# Patient Record
Sex: Female | Born: 2000 | Race: Black or African American | Hispanic: No | Marital: Single | Smoking: Never smoker
Health system: Southern US, Community
[De-identification: ages and names within clinical notes are randomized; demographics above are authoritative.]

## PROBLEM LIST (undated history)

## (undated) DIAGNOSIS — I619 Nontraumatic intracerebral hemorrhage, unspecified: Secondary | ICD-10-CM

## (undated) DIAGNOSIS — J302 Other seasonal allergic rhinitis: Secondary | ICD-10-CM

## (undated) DIAGNOSIS — H9190 Unspecified hearing loss, unspecified ear: Secondary | ICD-10-CM

## (undated) DIAGNOSIS — T7492XA Unspecified child maltreatment, confirmed, initial encounter: Secondary | ICD-10-CM

## (undated) DIAGNOSIS — R55 Syncope and collapse: Secondary | ICD-10-CM

## (undated) DIAGNOSIS — H53003 Unspecified amblyopia, bilateral: Secondary | ICD-10-CM

## (undated) DIAGNOSIS — J45909 Unspecified asthma, uncomplicated: Secondary | ICD-10-CM

## (undated) DIAGNOSIS — F909 Attention-deficit hyperactivity disorder, unspecified type: Secondary | ICD-10-CM

## (undated) DIAGNOSIS — T730XXA Starvation, initial encounter: Secondary | ICD-10-CM

## (undated) HISTORY — PX: EYE SURGERY: SHX253

## (undated) HISTORY — PX: TYMPANOSTOMY TUBE PLACEMENT: SHX32

---
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
## 2021-02-16 ENCOUNTER — Encounter: Payer: Self-pay | Admitting: Internal Medicine

## 2021-02-16 ENCOUNTER — Ambulatory Visit (INDEPENDENT_AMBULATORY_CARE_PROVIDER_SITE_OTHER): Payer: Medicaid Other | Admitting: Internal Medicine

## 2021-02-16 ENCOUNTER — Other Ambulatory Visit: Payer: Self-pay

## 2021-02-16 VITALS — BP 106/61 | HR 72 | Temp 97.5°F | Resp 17 | Ht 71.2 in | Wt 208.6 lb

## 2021-02-16 DIAGNOSIS — Z6828 Body mass index (BMI) 28.0-28.9, adult: Secondary | ICD-10-CM

## 2021-02-16 DIAGNOSIS — F902 Attention-deficit hyperactivity disorder, combined type: Secondary | ICD-10-CM | POA: Diagnosis not present

## 2021-02-16 DIAGNOSIS — F3341 Major depressive disorder, recurrent, in partial remission: Secondary | ICD-10-CM | POA: Diagnosis not present

## 2021-02-16 DIAGNOSIS — R519 Headache, unspecified: Secondary | ICD-10-CM

## 2021-02-16 DIAGNOSIS — K5909 Other constipation: Secondary | ICD-10-CM

## 2021-02-16 DIAGNOSIS — Z833 Family history of diabetes mellitus: Secondary | ICD-10-CM

## 2021-02-16 DIAGNOSIS — D500 Iron deficiency anemia secondary to blood loss (chronic): Secondary | ICD-10-CM | POA: Diagnosis not present

## 2021-02-16 DIAGNOSIS — Z136 Encounter for screening for cardiovascular disorders: Secondary | ICD-10-CM

## 2021-02-16 DIAGNOSIS — E663 Overweight: Secondary | ICD-10-CM | POA: Insufficient documentation

## 2021-02-16 DIAGNOSIS — E6609 Other obesity due to excess calories: Secondary | ICD-10-CM | POA: Insufficient documentation

## 2021-02-16 DIAGNOSIS — Z23 Encounter for immunization: Secondary | ICD-10-CM | POA: Diagnosis not present

## 2021-02-16 DIAGNOSIS — E66811 Obesity, class 1: Secondary | ICD-10-CM | POA: Insufficient documentation

## 2021-02-16 NOTE — Patient Instructions (Signed)
Anemia °Anemia is a condition in which there is not enough red blood cells or hemoglobin in the blood. Hemoglobin is a substance in red blood cells that carries oxygen. °When you do not have enough red blood cells or hemoglobin (are anemic), your body cannot get enough oxygen and your organs may not work properly. As a result, you may feel very tired or have other problems. °What are the causes? °Common causes of anemia include: °Excessive bleeding. Anemia can be caused by excessive bleeding inside or outside the body, including bleeding from the intestines or from heavy menstrual periods in females. °Poor nutrition. °Long-lasting (chronic) kidney, thyroid, and liver disease. °Bone marrow disorders, spleen problems, and blood disorders. °Cancer and treatments for cancer. °HIV (human immunodeficiency virus) and AIDS (acquired immunodeficiency syndrome). °Infections, medicines, and autoimmune disorders that destroy red blood cells. °What are the signs or symptoms? °Symptoms of this condition include: °Minor weakness. °Dizziness. °Headache, or difficulties concentrating and sleeping. °Heartbeats that feel irregular or faster than normal (palpitations). °Shortness of breath, especially with exercise. °Pale skin, lips, and nails, or cold hands and feet. °Indigestion and nausea. °Symptoms may occur suddenly or develop slowly. If your anemia is mild, you may not have symptoms. °How is this diagnosed? °This condition is diagnosed based on blood tests, your medical history, and a physical exam. In some cases, a test may be needed in which cells are removed from the soft tissue inside of a bone and looked at under a microscope (bone marrow biopsy). Your health care provider may also check your stool (feces) for blood and may do additional testing to look for the cause of your bleeding. °Other tests may include: °Imaging tests, such as a CT scan or MRI. °A procedure to see inside your esophagus and stomach (endoscopy). °A  procedure to see inside your colon and rectum (colonoscopy). °How is this treated? °Treatment for this condition depends on the cause. If you continue to lose a lot of blood, you may need to be treated at a hospital. Treatment may include: °Taking supplements of iron, vitamin B12, or folic acid. °Taking a hormone medicine (erythropoietin) that can help to stimulate red blood cell growth. °Having a blood transfusion. This may be needed if you lose a lot of blood. °Making changes to your diet. °Having surgery to remove your spleen. °Follow these instructions at home: °Take over-the-counter and prescription medicines only as told by your health care provider. °Take supplements only as told by your health care provider. °Follow any diet instructions that you were given by your health care provider. °Keep all follow-up visits as told by your health care provider. This is important. °Contact a health care provider if: °You develop new bleeding anywhere in the body. °Get help right away if: °You are very weak. °You are short of breath. °You have pain in your abdomen or chest. °You are dizzy or feel faint. °You have trouble concentrating. °You have bloody stools, black stools, or tarry stools. °You vomit repeatedly or you vomit up blood. °These symptoms may represent a serious problem that is an emergency. Do not wait to see if the symptoms will go away. Get medical help right away. Call your local emergency services (911 in the [place] Do not drive yourself to the hospital. °Summary °Anemia is a condition in which you do not have enough red blood cells or enough of a substance in your red blood cells that carries oxygen (hemoglobin). °Symptoms may occur suddenly or develop slowly. °If your anemia is   mild, you may not have symptoms. °This condition is diagnosed with blood tests, a medical history, and a physical exam. Other tests may be needed. °Treatment for this condition depends on the cause of the anemia. °This  information is not intended to replace advice given to you by your health care provider. Make sure you discuss any questions you have with your health care provider. °Document Revised: 12/16/2018 Document Reviewed: 12/16/2018 °Elsevier Patient Education © 2022 [employer] ° °

## 2021-02-16 NOTE — Assessment & Plan Note (Signed)
Encourage diet and exercise for weight loss 

## 2021-02-16 NOTE — Assessment & Plan Note (Signed)
Has recently improved
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
## 2021-02-16 NOTE — Assessment & Plan Note (Addendum)
Currently not medicated We will monitor

## 2021-02-16 NOTE — Assessment & Plan Note (Signed)
Currently on Effexor prescribed by neurology, not helping She will continue to follow with

## 2021-02-16 NOTE — Assessment & Plan Note (Signed)
Referral to psychologist placed per patient request Support offered  continue Effexor

## 2021-02-16 NOTE — Assessment & Plan Note (Signed)
CBC today.  

## 2021-02-17 ENCOUNTER — Encounter: Payer: Self-pay | Admitting: Internal Medicine

## 2021-02-17 LAB — COMPLETE METABOLIC PANEL WITH GFR
AG Ratio: 1.7 (calc) (ref 1.0–2.5)
ALT: 13 U/L (ref 5–32)
AST: 13 U/L (ref 12–32)
Albumin: 4.5 g/dL (ref 3.6–5.1)
Alkaline phosphatase (APISO): 69 U/L (ref 36–128)
BUN: 18 mg/dL (ref 7–20)
CO2: 26 mmol/L (ref 20–32)
Calcium: 9.3 mg/dL (ref 8.9–10.4)
Chloride: 106 mmol/L (ref 98–110)
Creat: 0.91 mg/dL (ref 0.50–0.96)
Globulin: 2.7 g/dL (calc) (ref 2.0–3.8)
Glucose, Bld: 84 mg/dL (ref 65–139)
Potassium: 4.1 mmol/L (ref 3.8–5.1)
Sodium: 139 mmol/L (ref 135–146)
Total Bilirubin: 0.4 mg/dL (ref 0.2–1.1)
Total Protein: 7.2 g/dL (ref 6.3–8.2)
eGFR: 93 mL/min/{1.73_m2} (ref >=60)

## 2021-02-17 LAB — HEMOGLOBIN A1C
Hgb A1c MFr Bld: 4.9 % of total Hgb (ref <5.7)
Mean Plasma Glucose: 94 mg/dL
eAG (mmol/L): 5.2 mmol/L

## 2021-02-17 LAB — LIPID PANEL
Cholesterol: 148 mg/dL (ref <170)
HDL: 50 mg/dL (ref >45)
LDL Cholesterol (Calc): 85 mg/dL (calc) (ref <110)
Non-HDL Cholesterol (Calc): 98 mg/dL (calc) (ref <120)
Total CHOL/HDL Ratio: 3 (calc) (ref <5.0)
Triglycerides: 53 mg/dL (ref <90)

## 2021-02-17 LAB — CBC
HCT: 31.5 % — ABNORMAL LOW (ref 35.0–45.0)
Hemoglobin: 10.1 g/dL — ABNORMAL LOW (ref 11.7–15.5)
MCH: 28.9 pg (ref 27.0–33.0)
MCHC: 32.1 g/dL (ref 32.0–36.0)
MCV: 90.3 fL (ref 80.0–100.0)
MPV: 10.6 fL (ref 7.5–12.5)
Platelets: 282 10*3/uL (ref 140–400)
RBC: 3.49 10*6/uL — ABNORMAL LOW (ref 3.80–5.10)
RDW: 13.8 % (ref 11.0–15.0)
WBC: 3.8 10*3/uL (ref 3.8–10.8)

## 2021-04-11 ENCOUNTER — Ambulatory Visit: Payer: Medicaid Other | Admitting: Internal Medicine

## 2021-05-05 ENCOUNTER — Other Ambulatory Visit: Payer: Self-pay

## 2021-05-05 ENCOUNTER — Observation Stay: Payer: Medicaid Other

## 2021-05-05 ENCOUNTER — Observation Stay
Admission: EM | Admit: 2021-05-05 | Discharge: 2021-05-06 | Disposition: A | Discharge status: Home / Self Care | Payer: Medicaid Other | Attending: Emergency Medicine | Admitting: Emergency Medicine

## 2021-05-05 ENCOUNTER — Emergency Department: Payer: Medicaid Other

## 2021-05-05 DIAGNOSIS — E876 Hypokalemia: Secondary | ICD-10-CM | POA: Diagnosis not present

## 2021-05-05 DIAGNOSIS — R569 Unspecified convulsions: Secondary | ICD-10-CM | POA: Diagnosis not present

## 2021-05-05 DIAGNOSIS — F3341 Major depressive disorder, recurrent, in partial remission: Secondary | ICD-10-CM | POA: Diagnosis not present

## 2021-05-05 DIAGNOSIS — Z79899 Other long term (current) drug therapy: Secondary | ICD-10-CM | POA: Insufficient documentation

## 2021-05-05 DIAGNOSIS — W01198A Fall on same level from slipping, tripping and stumbling with subsequent striking against other object, initial encounter: Secondary | ICD-10-CM | POA: Insufficient documentation

## 2021-05-05 DIAGNOSIS — J45909 Unspecified asthma, uncomplicated: Secondary | ICD-10-CM | POA: Insufficient documentation

## 2021-05-05 DIAGNOSIS — R55 Syncope and collapse: Secondary | ICD-10-CM

## 2021-05-05 LAB — BASIC METABOLIC PANEL
Anion gap: 21 — ABNORMAL HIGH (ref 5–15)
BUN: 11 mg/dL (ref 6–20)
CO2: 12 mmol/L — ABNORMAL LOW (ref 22–32)
Calcium: 9.4 mg/dL (ref 8.9–10.3)
Chloride: 106 mmol/L (ref 98–111)
Creatinine, Ser: 1.04 mg/dL — ABNORMAL HIGH (ref 0.44–1.00)
GFR, Estimated: >60 mL/min (ref >60)
Glucose, Bld: 157 mg/dL — ABNORMAL HIGH (ref 70–99)
Potassium: 3.1 mmol/L — ABNORMAL LOW (ref 3.5–5.1)
Sodium: 139 mmol/L (ref 135–145)

## 2021-05-05 LAB — CBC
HCT: 36 % (ref 36.0–46.0)
Hemoglobin: 10.6 g/dL — ABNORMAL LOW (ref 12.0–15.0)
MCH: 28.2 pg (ref 26.0–34.0)
MCHC: 29.4 g/dL — ABNORMAL LOW (ref 30.0–36.0)
MCV: 95.7 fL (ref 80.0–100.0)
Platelets: 378 10*3/uL (ref 150–400)
RBC: 3.76 MIL/uL — ABNORMAL LOW (ref 3.87–5.11)
RDW: 13.8 % (ref 11.5–15.5)
WBC: 13.1 10*3/uL — ABNORMAL HIGH (ref 4.0–10.5)
nRBC: 0 % (ref 0.0–0.2)

## 2021-05-05 LAB — HCG, QUANTITATIVE, PREGNANCY: hCG, Beta Chain, Quant, S: <1 m[IU]/mL (ref <5)

## 2021-05-05 LAB — TROPONIN I (HIGH SENSITIVITY)
Troponin I (High Sensitivity): 4 ng/L (ref <18)
Troponin I (High Sensitivity): 4 ng/L (ref <18)

## 2021-05-05 LAB — MAGNESIUM: Magnesium: 2.1 mg/dL (ref 1.7–2.4)

## 2021-05-05 LAB — CBG MONITORING, ED: Glucose-Capillary: 134 mg/dL — ABNORMAL HIGH (ref 70–99)

## 2021-05-05 LAB — CK: Total CK: 447 U/L — ABNORMAL HIGH (ref 38–234)

## 2021-05-05 LAB — PHOSPHORUS: Phosphorus: 5.2 mg/dL — ABNORMAL HIGH (ref 2.5–4.6)

## 2021-05-05 MED ORDER — SODIUM CHLORIDE 0.9 % IV SOLN
1500.0000 mg | INTRAVENOUS | Status: AC
  Administered 2021-05-05: 1500 mg via INTRAVENOUS
  Filled 2021-05-05: qty 30

## 2021-05-05 MED ORDER — LORAZEPAM 2 MG/ML IJ SOLN
1.0000 mg | Freq: Once | INTRAMUSCULAR | Status: DC | PRN
Start: 2021-05-05 — End: 2021-05-06

## 2021-05-05 MED ORDER — ACETAMINOPHEN 325 MG PO TABS
650.0000 mg | ORAL_TABLET | ORAL | Status: DC | PRN
  Administered 2021-05-05 – 2021-05-06 (×3): 650 mg via ORAL
  Filled 2021-05-05 (×3): qty 2

## 2021-05-05 MED ORDER — MIDAZOLAM HCL 2 MG/2ML IJ SOLN
4.0000 mg | Freq: Once | INTRAMUSCULAR | Status: AC
Start: 2021-05-05 — End: 2021-05-05
  Administered 2021-05-05: 4 mg via INTRAMUSCULAR

## 2021-05-05 MED ORDER — GADOBUTROL 1 MMOL/ML IV SOLN
9.0000 mL | Freq: Once | INTRAVENOUS | Status: AC | PRN
  Administered 2021-05-05: 9 mL via INTRAVENOUS

## 2021-05-05 MED ORDER — ACETAMINOPHEN 650 MG RE SUPP: 650.0000 mg | RECTAL | Status: DC | PRN

## 2021-05-05 MED ORDER — ORAL CARE MOUTH RINSE
15.0000 mL | OROMUCOSAL | Status: DC
  Administered 2021-05-05: 15 mL via OROMUCOSAL
  Filled 2021-05-05 (×5): qty 15

## 2021-05-05 MED ORDER — PNEUMOCOCCAL VAC POLYVALENT 25 MCG/0.5ML IJ INJ
0.5000 mL | INJECTION | INTRAMUSCULAR | Status: DC
  Filled 2021-05-05: qty 0.5

## 2021-05-05 MED ORDER — LEVETIRACETAM 500 MG PO TABS
500.0000 mg | ORAL_TABLET | Freq: Two times a day (BID) | ORAL | Status: DC
  Administered 2021-05-05 – 2021-05-06 (×2): 500 mg via ORAL
  Filled 2021-05-05 (×2): qty 1

## 2021-05-05 MED ORDER — ONDANSETRON HCL 4 MG PO TABS: 4.0000 mg | ORAL_TABLET | Freq: Four times a day (QID) | ORAL | Status: DC | PRN

## 2021-05-05 MED ORDER — PANTOPRAZOLE SODIUM 40 MG PO TBEC
40.0000 mg | DELAYED_RELEASE_TABLET | Freq: Every day | ORAL | Status: DC
  Administered 2021-05-05 – 2021-05-06 (×2): 40 mg via ORAL
  Filled 2021-05-05 (×2): qty 1

## 2021-05-05 MED ORDER — ONDANSETRON HCL 4 MG/2ML IJ SOLN: 4.0000 mg | Freq: Four times a day (QID) | INTRAMUSCULAR | Status: DC | PRN

## 2021-05-05 MED ORDER — LEVETIRACETAM IN NACL 1500 MG/100ML IV SOLN
1500.0000 mg | Freq: Once | INTRAVENOUS | Status: AC
Start: 2021-05-05 — End: 2021-05-05
  Administered 2021-05-05: 1500 mg via INTRAVENOUS
  Filled 2021-05-05 (×2): qty 100

## 2021-05-05 MED ORDER — SODIUM CHLORIDE 0.9 % IV BOLUS
1000.0000 mL | Freq: Once | INTRAVENOUS | Status: AC
  Administered 2021-05-05: 1000 mL via INTRAVENOUS

## 2021-05-05 MED ORDER — ACETAMINOPHEN 325 MG PO TABS: 650.0000 mg | ORAL_TABLET | Freq: Four times a day (QID) | ORAL | Status: DC | PRN

## 2021-05-05 MED ORDER — POTASSIUM CHLORIDE IN NACL 40-0.9 MEQ/L-% IV SOLN
INTRAVENOUS | Status: AC
  Filled 2021-05-05 (×2): qty 1000

## 2021-05-05 MED ORDER — LORAZEPAM 2 MG/ML IJ SOLN: 4.0000 mg | INTRAMUSCULAR | Status: DC | PRN

## 2021-05-05 MED ORDER — CHLORHEXIDINE GLUCONATE 0.12% ORAL RINSE (MEDLINE KIT)
15.0000 mL | Freq: Two times a day (BID) | OROMUCOSAL | Status: DC
  Administered 2021-05-05 – 2021-05-06 (×2): 15 mL via OROMUCOSAL
  Filled 2021-05-05: qty 15

## 2021-05-05 MED ORDER — VENLAFAXINE HCL ER 75 MG PO CP24
75.0000 mg | ORAL_CAPSULE | Freq: Every day | ORAL | Status: DC
  Administered 2021-05-06: 75 mg via ORAL
  Filled 2021-05-05: qty 1

## 2021-05-05 MED ORDER — MIDAZOLAM HCL 2 MG/2ML IJ SOLN
INTRAMUSCULAR | Status: AC
  Filled 2021-05-05: qty 4
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
## 2021-05-05 NOTE — ED Notes (Signed)
PT mother called out to this RN asking for "Someone to come look at my daughter". This RN arrived to the room where mother was at bedside with patient who was alert. Mother endorsing that she believes that patient is having focal seizures. Pt is "not paying attention, and is shaking her legs and starting off". Pt appeared restless in stretcher, shaking legs bilaterally, pt appears to be having difficulty concentrating. Pt alert and able to follow simple instructions with direction. Mother endorsing that her behavior is abnormal. ?

## 2021-05-05 NOTE — ED Notes (Signed)
Pt back to ED, from MRI and transferred to IPUnit  ?

## 2021-05-05 NOTE — ED Notes (Signed)
Cbg finger stick 134. Novostat machine unable to accept pr MRN ?

## 2021-05-05 NOTE — ED Provider Notes (Signed)
Dr. Rip Harbour, previous attending unavailable for emergency request. ? ?I immediately saw and evaluate the patient requested nursing, concerned that patient is now experiencing generalized seizure disorder.  Patient is having generalized shaking seizure-like activity with tonic-clonic movements lasting about 60 seconds.  No hypoxia no obvious aspiration.  She became calm but slightly confused thereafter.  She also on exam has evidence of hematoma over the posterior occipital region after a fall today.  Stat CT of the head has been ordered, midazolam intramuscular has been given for columning as well as antiepileptic activity, I have also ordered of Cerebyx load.  Anticipate need for admission, rule out acute intracranial hemorrhage or trauma.  Mother at the bedside reports she is also been suspicious for years that the patient may have a seizure disorder, but has never been started on any antiepileptic. ? ?Patient is respirating comfortably, without hypoxia or evidence of acute airway compromise.  Continue to monitor very closely as we await CT imaging and remaining results. ? ?EKG is reviewed inter by me at 1143 ?Heart rate 120 ?QRS 110 ?QTc 460 ?Sinus tachycardia, no evidence of acute ischemia ? ? ?CRITICAL CARE ?Performed by: Delman Kitten ? ? ?Total critical care time: 30 minutes ? ?Critical care time was exclusive of separately billable procedures and treating other patients. ? ?Critical care was necessary to treat or prevent imminent or life-threatening deterioration. ? ?Critical care was time spent personally by me on the following activities: development of treatment plan with patient and/or surrogate as well as nursing, discussions with consultants, evaluation of patient's response to treatment, examination of patient, obtaining history from patient or surrogate, ordering and performing treatments and interventions, ordering and review of laboratory studies, ordering and review of radiographic studies, pulse  oximetry and re-evaluation of patient's condition. ? ? ?----------------------------------------- ?3:16 PM on 05/05/2021 ?----------------------------------------- ?Patient calm, no further seizure-like activity noted in the ED to this point.  Currently awaiting formal consult by Dr. Quinn Axe who is advised she will see and evaluate provide consult recommendations.  Orders have been placed by neurology and MRI of the brain pending.  Ongoing care assigned to Dr. Vladimir Crofts, and NP Vanessa Cadiz continues to follow as well.  Patient with fall, and head injury possibly secondary to syncopal episode or possible brief absence type seizure, followed by generalized seizure noted in the ED.  Currently await neurology recommendations and MRI.  Anticipate likely admission for further care and treatment.  Remains on seizure precautions ? ?  Delman Kitten, MD ?05/05/21 1517 ? ?
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
## 2021-05-05 NOTE — ED Notes (Signed)
Pt in EEG with EEG tech ?

## 2021-05-05 NOTE — Progress Notes (Signed)
Neurology recommendations: ?- No more phenytoin ?- Keppra 1500mg  load f/b 500mg  bid ?- rEEG ?- MRI brain with and without contrast ?- 1mg  IV ativan prn for anxiety prior to MRI ?- Seizure precautions ? ?Full consult note to follow. ? ? , MD ?[facility] ?[phone] ? ?If 7pm- 7am, please page neurology on call as listed in AMION. ? ?

## 2021-05-05 NOTE — ED Triage Notes (Addendum)
Pt to ED with mother for witnessed fall. Pt states unsure what happened. Mother reports history of vasovagal. Possible hx of cognitive impairment ?Pt reports pain to head "all over" ?

## 2021-05-05 NOTE — ED Notes (Signed)
Called pharmacy in regards to pt Keppra ordered that has not arrived at this time. ?

## 2021-05-05 NOTE — Assessment & Plan Note (Signed)
Patient presents to the ER for evaluation of a fall thought to be secondary to vasovagal syncope and had a witnessed seizure in the emergency room ?Continue Keppra 500 mg IV twice daily ?Ativan as needed breakthrough seizures ?Place patient on seizure precautions ?Obtain MRI of the brain for further evaluation ?Consult neurology ?

## 2021-05-05 NOTE — Assessment & Plan Note (Signed)
Stable.  Continue Effexor. 

## 2021-05-05 NOTE — Assessment & Plan Note (Signed)
Patient has a known history of vasovagal syncope and had an episode this morning prior to admission that was witnessed by her mother ?We will place patient on a cardiac monitor to rule out arrhythmias ?

## 2021-05-05 NOTE — ED Notes (Signed)
This RN was called to bedside via call light and upon entering the room, RN Annette Stable was at bedside rolling patient onto right side. PT was having seizure lasting approx one minute. Pt airway supported. Seizure pads placed. MD called to bedside for assessment ?

## 2021-05-05 NOTE — Assessment & Plan Note (Signed)
Supplement potassium ?Magnesium is within normal limits ?

## 2021-05-05 NOTE — ED Notes (Signed)
Called unit 2A to inform them of patient transfer ?

## 2021-05-05 NOTE — Progress Notes (Signed)
Eeg done 

## 2021-05-06 ENCOUNTER — Encounter: Payer: Self-pay | Admitting: Internal Medicine

## 2021-05-06 ENCOUNTER — Observation Stay
Admit: 2021-05-06 | Discharge: 2021-05-06 | Disposition: A | Discharge status: Home / Self Care | Payer: Medicaid Other | Attending: Internal Medicine | Admitting: Internal Medicine

## 2021-05-06 DIAGNOSIS — F3341 Major depressive disorder, recurrent, in partial remission: Secondary | ICD-10-CM | POA: Diagnosis not present

## 2021-05-06 DIAGNOSIS — R569 Unspecified convulsions: Secondary | ICD-10-CM | POA: Diagnosis not present

## 2021-05-06 DIAGNOSIS — E876 Hypokalemia: Secondary | ICD-10-CM | POA: Diagnosis not present

## 2021-05-06 LAB — BASIC METABOLIC PANEL
Anion gap: 6 (ref 5–15)
BUN: 9 mg/dL (ref 6–20)
CO2: 23 mmol/L (ref 22–32)
Calcium: 8.4 mg/dL — ABNORMAL LOW (ref 8.9–10.3)
Chloride: 109 mmol/L (ref 98–111)
Creatinine, Ser: 0.82 mg/dL (ref 0.44–1.00)
GFR, Estimated: >60 mL/min (ref >60)
Glucose, Bld: 95 mg/dL (ref 70–99)
Potassium: 3.5 mmol/L (ref 3.5–5.1)
Sodium: 138 mmol/L (ref 135–145)

## 2021-05-06 LAB — ECHOCARDIOGRAM COMPLETE
AR max vel: 2.32 cm2
AV Peak grad: 5.9 mmHg
Ao pk vel: 1.21 m/s
Area-P 1/2: 4.6 cm2
Calc EF: 61.6 %
Height: 71.5 in
S' Lateral: 2.89 cm
Single Plane A2C EF: 61.4 %
Single Plane A4C EF: 65.7 %
Weight: 3192.26 oz

## 2021-05-06 LAB — CBC
HCT: 28.8 % — ABNORMAL LOW (ref 36.0–46.0)
Hemoglobin: 8.9 g/dL — ABNORMAL LOW (ref 12.0–15.0)
MCH: 27.7 pg (ref 26.0–34.0)
MCHC: 30.9 g/dL (ref 30.0–36.0)
MCV: 89.7 fL (ref 80.0–100.0)
Platelets: 255 10*3/uL (ref 150–400)
RBC: 3.21 MIL/uL — ABNORMAL LOW (ref 3.87–5.11)
RDW: 14 % (ref 11.5–15.5)
WBC: 6.6 10*3/uL (ref 4.0–10.5)
nRBC: 0 % (ref 0.0–0.2)

## 2021-05-06 LAB — HIV ANTIBODY (ROUTINE TESTING W REFLEX): HIV Screen 4th Generation wRfx: NONREACTIVE

## 2021-05-06 MED ORDER — LEVETIRACETAM 500 MG PO TABS: 500.0000 mg | ORAL_TABLET | Freq: Two times a day (BID) | ORAL | 0 refills | Status: DC

## 2021-05-06 NOTE — Progress Notes (Signed)
*  PRELIMINARY RESULTS* ?Echocardiogram ?2D Echocardiogram has been performed. ? ?Lenor Coffin ?05/06/2021, 11:39 AM ?

## 2021-05-06 NOTE — Progress Notes (Signed)
Deborah Rhodes to be D/C'd Home per MD order.  Discussed prescriptions and follow up appointments with the patient. Prescription electronically submitted, medication list explained in detail. Pt and mother verbalized understanding. ? ?Allergies as of 05/06/2021   ?No Known Allergies ?  ? ?  ?Medication List  ?  ? ?STOP taking these medications   ? ?ferrous sulfate 325 (65 FE) MG tablet ?  ?Methylphenidate HCl 30 MG Cher chewable tablet ?Commonly known as: QUILLICHEW ER ?  ? ?  ? ?TAKE these medications   ? ?levETIRAcetam 500 MG tablet ?Commonly known as: KEPPRA ?Take 1 tablet (500 mg total) by mouth 2 (two) times daily. ?  ?venlafaxine XR 75 MG 24 hr capsule ?Commonly known as: EFFEXOR-XR ?Take by mouth. ?  ? ?  ? ? ?Vitals:  ? 05/06/21 0803 05/06/21 1115  ?BP: 111/79 105/70  ?Pulse: 71 69  ?Resp: 17 14  ?Temp: 97.6 ?F (36.4 ?C) 98.2 ?F (36.8 ?C)  ?SpO2: 100% 100%  ? ? ?Skin clean, dry and intact without evidence of skin break down, no evidence of skin tears noted. IV catheter discontinued intact. Site without signs and symptoms of complications. Dressing and pressure applied. Pt denies pain at this time. No complaints noted. ? ?An After Visit Summary was printed and given to the patient. ?Patient escorted via WC, and D/C home via private auto. ? ?Deborah Rhodes Marylou Flesher  ?

## 2021-05-06 NOTE — Discharge Summary (Signed)
?Physician Discharge Summary ?  ?Patient: Deborah Rhodes MRN: [id number] DOB: [birth year]  ?Admit date:     05/05/2021  ?Discharge date: 05/06/2021  ?Discharge Physician: Jennye Boroughs  ? ?PCP: Jearld Fenton, NP  ? ?Recommendations at discharge:  ? ?Obtain Keppra refills from PCP or neurologist before urology.   ?Follow-up with Dr. Melrose Nakayama, neurologist, as scheduled on May 29, 2021 ?No driving, swimming or high risk activities until cleared by physician or neurologist to do so ? ?Discharge Diagnoses: ?Principal Problem: ?  Seizure ([place]) ?Active Problems: ?  Recurrent major depressive disorder, in partial remission ([place]) ?  Hypokalemia ?  Syncope, vasovagal ? ?Resolved Problems: ?  * No resolved hospital problems. * ? ?Hospital Course: ? ?Deborah Rhodes is a 21 y.o. female with medical history significant for ADHD, asthma, hard of hearing (uses bilateral hearing aids), vasovagal episodes, who was brought to the hospital after witnessed fall.  According to her mother, she lost consciousness for about 30 seconds and then returned to her baseline mental state.  She was brought to the emergency room for further evaluation.  In the ED, she had a tonic-clonic seizure with tongue biting that was witnessed.  She was said to be postictal after the seizure. ? ?She was admitted to the hospital for seizure.  She was treated with IV Dilantin and IV Keppra and was subsequently switched to oral Keppra.  EEG did not show any evidence of epileptiform activity.  No acute abnormality on MRI brain.  She had hypokalemia that was treated.  She is deemed stable for discharge.  Discharge plan was discussed with the patient and her mother at the bedside.  The importance of medical adherence was reiterated. ? ? ? ? ? ?  ? ? ?Consultants: Neurologist ?Procedures performed: None ?Disposition: Home ?Diet recommendation:  ?Discharge Diet Orders (From admission, onward)  ? ?  Start     Ordered  ? 05/06/21 0000  Diet general        ? 05/06/21 1409  ? ?  ?  ? ?  ? ?Regular diet ?DISCHARGE MEDICATION: ?Allergies as of 05/06/2021   ?No Known Allergies ?  ? ?  ?Medication List  ?  ? ?STOP taking these medications   ? ?ferrous sulfate 325 (65 FE) MG tablet ?  ?Methylphenidate HCl 30 MG Cher chewable tablet ?Commonly known as: QUILLICHEW ER ?  ? ?  ? ?TAKE these medications   ? ?levETIRAcetam 500 MG tablet ?Commonly known as: KEPPRA ?Take 1 tablet (500 mg total) by mouth 2 (two) times daily. ?  ?venlafaxine XR 75 MG 24 hr capsule ?Commonly known as: EFFEXOR-XR ?Take by mouth. ?  ? ?  ? ? ?Discharge Exam: ?Filed Weights  ? 05/05/21 1133 05/05/21 1940  ?Weight: 96.2 kg 90.5 kg  ? ?GEN: NAD ?SKIN: No rash ?EYES: EOMI ?ENT: MMM ?CV: RRR ?PULM: CTA B ?ABD: soft, ND, NT, +BS ?CNS: AAO x 3, non focal ?EXT: No edema or tenderness ? ? ?Condition at discharge: good ? ?The results of significant diagnostics from this hospitalization (including imaging, microbiology, ancillary and laboratory) are listed below for reference.  ? ?Imaging Studies: ?CT Head Wo Contrast ? ?Result Date: 05/05/2021 ?CLINICAL DATA:  Headache, new or worsening, posttraumatic. Poly trauma, blunt. Additional history provided: Witnessed fall. EXAM: CT HEAD WITHOUT CONTRAST CT CERVICAL SPINE WITHOUT CONTRAST TECHNIQUE: Multidetector CT imaging of the head and cervical spine was performed following the standard protocol without intravenous contrast. Multiplanar CT image reconstructions of the  cervical spine were also generated. RADIATION DOSE REDUCTION: This exam was performed according to the departmental dose-optimization program which includes automated exposure control, adjustment of the mA and/or kV according to patient size and/or use of iterative reconstruction technique. COMPARISON:  Brain MRI 10/31/2020. FINDINGS: CT HEAD FINDINGS Brain: Similar to the prior brain MRI of 10/31/2020, there is moderate ventriculomegaly with central predominant volume loss and an abnormal morphology of  the lateral ventricles. Also unchanged, there is marked thinning of the posterior body and splenium of the corpus callosum. Cavum septum pellucidum and cavum vergae. There is no acute intracranial hemorrhage. No demarcated cortical infarct. No extra-axial fluid collection. No evidence of an intracranial mass. No midline shift. Vascular: No hyperdense vessel. Skull: Normal. Negative for fracture or focal lesion. Sinuses/Orbits: Visualized orbits show no acute finding. No significant paranasal sinus disease at the imaged levels. CT CERVICAL SPINE FINDINGS Mildly motion degraded exam. Alignment: Cervical levocurvature. Reversal of the expected cervical lordosis. No significant spondylolisthesis. Skull base and vertebrae: The basion-dental and atlanto-dental intervals are maintained.No evidence of acute fracture to the cervical spine. Congenital nonunion of the posterior arch of C1. Soft tissues and spinal canal: No prevertebral fluid or swelling. No visible canal hematoma. Disc levels: No significant bony spinal canal or neural foraminal narrowing at any level. Upper chest: No consolidation within the imaged lung apices. No visible pneumothorax. IMPRESSION: CT head: 1. No evidence of acute intracranial abnormality. 2. Similar to the prior brain MRI of 10/31/2020, there is moderate ventriculomegaly with central predominant volume loss and an abnormal morphology of the lateral ventricles. Also unchanged, there is marked thinning of the posterior body and splenium of the corpus callosum. 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 CT cervical spine: 1. No evidence of acute fracture to the cervical spine. 2. Nonspecific reversal of the expected cervical lordosis. 3. Cervical levocurvature. Electronically Signed   By: Kellie Simmering D.O.   On: 05/05/2021 13:29  ? ?CT Cervical Spine Wo Contrast ? ?Result Date: 05/05/2021 ?CLINICAL DATA:  Headache, new or worsening, posttraumatic. Poly trauma, blunt. Additional  history provided: Witnessed fall. EXAM: CT HEAD WITHOUT CONTRAST CT CERVICAL SPINE WITHOUT CONTRAST TECHNIQUE: Multidetector CT imaging of the head and cervical spine was performed following the standard protocol without intravenous contrast. Multiplanar CT image reconstructions of the cervical spine were also generated. RADIATION DOSE REDUCTION: This exam was performed according to the departmental dose-optimization program which includes automated exposure control, adjustment of the mA and/or kV according to patient size and/or use of iterative reconstruction technique. COMPARISON:  Brain MRI 10/31/2020. FINDINGS: CT HEAD FINDINGS Brain: Similar to the prior brain MRI of 10/31/2020, there is moderate ventriculomegaly with central predominant volume loss and an abnormal morphology of the lateral ventricles. Also unchanged, there is marked thinning of the posterior body and splenium of the corpus callosum. Cavum septum pellucidum and cavum vergae. There is no acute intracranial hemorrhage. No demarcated cortical infarct. No extra-axial fluid collection. No evidence of an intracranial mass. No midline shift. Vascular: No hyperdense vessel. Skull: Normal. Negative for fracture or focal lesion. Sinuses/Orbits: Visualized orbits show no acute finding. No significant paranasal sinus disease at the imaged levels. CT CERVICAL SPINE FINDINGS Mildly motion degraded exam. Alignment: Cervical levocurvature. Reversal of the expected cervical lordosis. No significant spondylolisthesis. Skull base and vertebrae: The basion-dental and atlanto-dental intervals are maintained.No evidence of acute fracture to the cervical spine. Congenital nonunion of the posterior arch of C1. Soft tissues and spinal canal: No prevertebral fluid or swelling. No visible  canal hematoma. Disc levels: No significant bony spinal canal or neural foraminal narrowing at any level. Upper chest: No consolidation within the imaged lung apices. No visible  pneumothorax. IMPRESSION: CT head: 1. No evidence of acute intracranial abnormality. 2. Similar to the prior brain MRI of 10/31/2020, there is moderate ventriculomegaly with central predominant volume loss and a
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
## 2021-05-06 NOTE — Procedures (Signed)
Routine EEG Report ? ?Deborah Rhodes is a 21 y.o. female with a history of seizure who is undergoing an EEG to evaluate for seizures. ? ?Report: This EEG was acquired with electrodes placed according to the International 10-20 electrode system (including Fp1, Fp2, F3, F4, C3, C4, P3, P4, O1, O2, T3, T4, T5, T6, A1, A2, Fz, Cz, Pz). The following electrodes were missing or displaced: none. ? ?The occipital dominant rhythm was 7 Hz. This activity is reactive to stimulation. Drowsiness was manifested by background fragmentation; deeper stages of sleep were identified by K complexes and sleep spindles. There was no focal slowing. There were no interictal epileptiform discharges. There were no electrographic seizures identified. Photic stimulation and hyperventilation were not performed. ? ?Impression and clinical correlation: This EEG was obtained while awake and asleep and is abnormal due to mild diffuse slowing indicative of global cerebral dysfunction. Epileptiform abnormalities were not seen during this recording. ? ?Bing Neighbors, MD ?[facility] ?[phone] ? ?If 7pm- 7am, please page neurology on call as listed in [facility].  ?

## 2021-05-06 NOTE — Progress Notes (Signed)
?  Transition of Care (TOC) Screening Note ? ? ?Patient Details  ?Name: Deborah Rhodes ?Date of Birth: 07/15/2000 ? ? ?Transition of Care (TOC) CM/SW Contact:    ?Gildardo Griffes, LCSW ?Phone Number: ?05/06/2021, 2:16 PM ? ? ? ?Transition of Care Department [facility]) has reviewed patient and no TOC needs have been identified at this time. We will continue to monitor patient advancement through interdisciplinary progression rounds. If new patient transition needs arise, please place a TOC consult. ? Angeline Slim, [place] ?[phone] ? ?

## 2021-05-08 ENCOUNTER — Telehealth: Payer: Self-pay

## 2021-05-08 NOTE — Telephone Encounter (Signed)
Will discuss at upcoming appt.

## 2021-05-08 NOTE — Telephone Encounter (Signed)
Transition Care Management Follow-up Telephone Call ?Date of discharge and from where: [place] 05-06-21 Dx: seizure ?How have you been since you were released from the hospital? Improving but not herself yet ?Any questions or concerns? No ? ?Items Reviewed: ?Did the pt receive and understand the discharge instructions provided? Yes  ?Medications obtained and verified? Yes  ?Other? No  ?Any new allergies since your discharge? No  ?Dietary orders reviewed? Yes ?Do you have support at home? Yes  ? ?Home Care and Equipment/Supplies: ?Were home health services ordered? no ?If so, what is the name of the agency? na  ?Has the agency set up a time to come to the patient's home? not applicable ?Were any new equipment or medical supplies ordered?  No ?What is the name of the medical supply agency? na ?Were you able to get the supplies/equipment? not applicable ?Do you have any questions related to the use of the equipment or supplies? No ? ?Functional Questionnaire: (I = Independent and D = Dependent) ?ADLs: I ? ?Bathing/Dressing- I ? ?Meal Prep- I ? ?Eating- I ? ?Maintaining continence- I ? ?Transferring/Ambulation- I ? ?Managing Meds- I ? ?Follow up appointments reviewed: ? ?PCP Hospital f/u appt confirmed? Yes  Scheduled to see Dr Garnette Gunner on 05-09-21 @ 9am. ?[facility] f/u appt confirmed? Yes  Scheduled to see Dr Melrose Nakayama on 05-29-21 @ 1015am. ?Are transportation arrangements needed? No  ?If their condition worsens, is the pt aware to call PCP or go to the Emergency Dept.? Yes ?Was the patient provided with contact information for the PCP's office or ED? Yes ?Was to pt encouraged to call back with questions or concerns? Yes  ?

## 2021-05-09 ENCOUNTER — Ambulatory Visit (INDEPENDENT_AMBULATORY_CARE_PROVIDER_SITE_OTHER): Payer: Medicaid Other | Admitting: Internal Medicine

## 2021-05-09 ENCOUNTER — Encounter: Payer: Self-pay | Admitting: Internal Medicine

## 2021-05-09 VITALS — BP 128/72 | HR 71 | Temp 97.1°F | Wt 203.0 lb

## 2021-05-09 DIAGNOSIS — R519 Headache, unspecified: Secondary | ICD-10-CM

## 2021-05-09 DIAGNOSIS — R748 Abnormal levels of other serum enzymes: Secondary | ICD-10-CM | POA: Diagnosis not present

## 2021-05-09 DIAGNOSIS — R569 Unspecified convulsions: Secondary | ICD-10-CM | POA: Diagnosis not present

## 2021-05-09 DIAGNOSIS — E876 Hypokalemia: Secondary | ICD-10-CM | POA: Diagnosis not present

## 2021-05-09 DIAGNOSIS — D509 Iron deficiency anemia, unspecified: Secondary | ICD-10-CM | POA: Diagnosis not present

## 2021-05-09 DIAGNOSIS — G8929 Other chronic pain: Secondary | ICD-10-CM

## 2021-05-09 DIAGNOSIS — R42 Dizziness and giddiness: Secondary | ICD-10-CM

## 2021-05-09 NOTE — Patient Instructions (Signed)
Seizure, Adult A seizure is a sudden burst of abnormal electrical and chemical activity in the brain. Seizures usually last from 30 seconds to 2 minutes.  What are the causes? Common causes of this condition include: Fever or infection. Problems that affect the brain. These may include: A brain or head injury. Bleeding in the brain. A brain tumor. Low levels of blood sugar or salt. Kidney problems or liver problems. Conditions that are passed from parent to child (are inherited). Problems with a substance, such as: Having a reaction to a drug or a medicine. Stopping the use of a substance all of a sudden (withdrawal). A stroke. Disorders that affect how you develop. Sometimes, the cause may not be known.  What increases the risk? Having someone in your family who has epilepsy. In this condition, seizures happen again and again over time. They have no clear cause. Having had a tonic-clonic seizure before. This type of seizure causes you to: Tighten the muscles of the whole body. Lose consciousness. Having had a head injury or strokes before. Having had a lack of oxygen at birth. What are the signs or symptoms? There are many types of seizures. The symptoms vary depending on the type of seizure you have. Symptoms during a seizure Shaking that you cannot control (convulsions) with fast, jerky movements of muscles. Stiffness of the body. Breathing problems. Feeling mixed up (confused). Staring or not responding to sound or touch. Head nodding. Eyes that blink, flutter, or move fast. Drooling, grunting, or making clicking sounds with your mouth Losing control of when you pee or poop. Symptoms before a seizure Feeling afraid, nervous, or worried. Feeling like you may vomit. Feeling like: You are moving when you are not. Things around you are moving when they are not. Feeling like you saw or heard something before (dj vu). Odd tastes or smells. Changes in how you see. You may  see flashing lights or spots. Symptoms after a seizure Feeling confused. Feeling sleepy. Headache. Sore muscles. How is this treated? If your seizure stops on its own, you will not need treatment. If your seizure lasts longer than 5 minutes, you will normally need treatment. Treatment may include: Medicines given through an IV tube. Avoiding things, such as medicines, that are known to cause your seizures. Medicines to prevent seizures. A device to prevent or control seizures. Surgery. A diet low in carbohydrates and high in fat (ketogenic diet). Follow these instructions at home: Medicines Take over-the-counter and prescription medicines only as told by your doctor. Avoid foods or drinks that may keep your medicine from working, such as alcohol. Activity Follow instructions about driving, swimming, or doing things that would be dangerous if you had another seizure. Wait until your doctor says it is safe for you to do these things. If you live in the [place] ask your local department of motor vehicles when you can drive. Get a lot of rest. Teaching others  Teach friends and family what to do when you have a seizure. They should: Help you get down to the ground. Protect your head and body. Loosen any clothing around your neck. Turn you on your side. Know whether or not you need emergency care. Stay with you until you are better. Also, tell them what not to do if you have a seizure. Tell them: They should not hold you down. They should not put anything in your mouth. General instructions Avoid anything that gives you seizures. Keep a seizure diary. Write down: What you remember   about each seizure. What you think caused each seizure. Keep all follow-up visits. Contact a doctor if: You have another seizure or seizures. Call the doctor each time you have a seizure. The pattern of your seizures changes. You keep having seizures with treatment. You have symptoms of being sick or  having an infection. You are not able to take your medicine. Get help right away if: You have any of these problems: A seizure that lasts longer than 5 minutes. Many seizures in a row and you do not feel better between seizures. A seizure that makes it harder to breathe. A seizure and you can no longer speak or use part of your body. You do not wake up right after a seizure. You get hurt during a seizure. You feel confused or have pain right after a seizure. These symptoms may be an emergency. Get help right away. Call your local emergency services (911 in the [place] Do not wait to see if the symptoms will go away. Do not drive yourself to the hospital. Summary A seizure is a sudden burst of abnormal electrical and chemical activity in the brain. Seizures normally last from 30 seconds to 2 minutes. Causes of seizures include illness, injury to the head, low levels of blood sugar or salt, and certain conditions. Most seizures will stop on their own in less than 5 minutes. Seizures that last longer than 5 minutes are a medical emergency and need treatment right away. Many medicines are used to treat seizures. Take over-the-counter and prescription medicines only as told by your doctor. This information is not intended to replace advice given to you by your health care provider. Make sure you discuss any questions you have with your health care provider. Document Revised: 07/17/2019 Document Reviewed: 07/17/2019 Elsevier Patient Education  2023 [employer]vier Inc[employer]  
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
## 2021-05-10 ENCOUNTER — Encounter: Payer: Self-pay | Admitting: Internal Medicine

## 2021-05-10 LAB — CBC AND DIFFERENTIAL
HCT: 32 — AB (ref 36–46)
Hemoglobin: 10.6 — AB (ref 12.0–16.0)
Platelets: 327 10*3/uL (ref 150–400)
WBC: 4.4

## 2021-05-10 LAB — IRON,TIBC AND FERRITIN PANEL
Ferritin: 21
Iron: 33
TIBC: 383
UIBC: 350

## 2021-05-10 LAB — BASIC METABOLIC PANEL
BUN: 12 (ref 4–21)
Chloride: 102 (ref 99–108)
Creatinine: 0.8 (ref 0.5–1.1)
Glucose: 90
Potassium: 4.2 mEq/L (ref 3.5–5.1)
Sodium: 140 (ref 137–147)

## 2021-05-10 LAB — COMPREHENSIVE METABOLIC PANEL
Calcium: 9.7 (ref 8.7–10.7)
eGFR: 106

## 2021-05-10 LAB — CBC: RBC: 3.7 — AB (ref 3.87–5.11)

## 2021-05-15 ENCOUNTER — Encounter: Payer: Self-pay | Admitting: Internal Medicine

## 2021-05-19 ENCOUNTER — Telehealth: Payer: Self-pay | Admitting: Internal Medicine

## 2021-05-19 NOTE — Telephone Encounter (Signed)
I have not seen these results. Can we have them refax them. ?

## 2021-05-19 NOTE — Telephone Encounter (Signed)
Copied from CRM [id number]. Topic: General - Other ?>> May 19, 2021 11:35 AM Jaquita Rector A wrote: ?Reason for CRM: Patient mom called in to inform regina Baity that she is waiting to hear about the results of her labs. Per mom she called on 05/15/21 and have yet to get a call back. Please advise and call mom at  Ph# [phone] ?

## 2021-05-19 NOTE — Telephone Encounter (Signed)
Called [facility].  They are going to fax her results again.  ? ?Thank you,  ? ?-Vernona Rieger ?

## 2021-05-22 NOTE — Telephone Encounter (Signed)
Patient's mother is calling to find out the results of the the lab work. Mother is fine to get lab work redone at [facility]. If that would help speed up the process. Please advise  ?

## 2021-05-23 ENCOUNTER — Encounter: Payer: Self-pay | Admitting: Internal Medicine

## 2021-05-23 NOTE — Telephone Encounter (Signed)
Pt's mom advised of lab results. (On DPR).  See scanned report.  ? ?Thanks,  ? ?-Vernona Rieger  ?

## 2021-09-11 ENCOUNTER — Ambulatory Visit (INDEPENDENT_AMBULATORY_CARE_PROVIDER_SITE_OTHER): Payer: Medicaid Other | Admitting: Internal Medicine

## 2021-09-11 ENCOUNTER — Encounter: Payer: Self-pay | Admitting: Internal Medicine

## 2021-09-11 VITALS — BP 118/68 | HR 84 | Temp 97.3°F | Ht 71.5 in | Wt 214.0 lb

## 2021-09-11 DIAGNOSIS — Z0000 Encounter for general adult medical examination without abnormal findings: Secondary | ICD-10-CM

## 2021-09-11 DIAGNOSIS — Z6829 Body mass index (BMI) 29.0-29.9, adult: Secondary | ICD-10-CM

## 2021-09-11 DIAGNOSIS — E663 Overweight: Secondary | ICD-10-CM

## 2021-09-11 DIAGNOSIS — Z0001 Encounter for general adult medical examination with abnormal findings: Secondary | ICD-10-CM

## 2021-09-11 DIAGNOSIS — D500 Iron deficiency anemia secondary to blood loss (chronic): Secondary | ICD-10-CM | POA: Diagnosis not present

## 2021-09-11 DIAGNOSIS — R61 Generalized hyperhidrosis: Secondary | ICD-10-CM | POA: Insufficient documentation

## 2021-09-11 DIAGNOSIS — Z1159 Encounter for screening for other viral diseases: Secondary | ICD-10-CM

## 2021-09-11 MED ORDER — DRYSOL 20 % EX SOLN: Freq: Every day | CUTANEOUS | 5 refills | Status: DC

## 2021-09-11 NOTE — Patient Instructions (Signed)
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
## 2021-09-11 NOTE — Assessment & Plan Note (Signed)
Encourage diet and exercise for weight loss 

## 2021-09-11 NOTE — Assessment & Plan Note (Signed)
We will trial Drysol

## 2021-09-12 LAB — CBC
HCT: 31.9 % — ABNORMAL LOW (ref 35.0–45.0)
Hemoglobin: 10 g/dL — ABNORMAL LOW (ref 11.7–15.5)
MCH: 27.5 pg (ref 27.0–33.0)
MCHC: 31.3 g/dL — ABNORMAL LOW (ref 32.0–36.0)
MCV: 87.6 fL (ref 80.0–100.0)
MPV: 9.4 fL (ref 7.5–12.5)
Platelets: 286 10*3/uL (ref 140–400)
RBC: 3.64 10*6/uL — ABNORMAL LOW (ref 3.80–5.10)
RDW: 13.7 % (ref 11.0–15.0)
WBC: 4.5 10*3/uL (ref 3.8–10.8)

## 2021-09-12 LAB — IRON,TIBC AND FERRITIN PANEL
%SAT: 10 % (calc) — ABNORMAL LOW (ref 15–45)
Ferritin: 10 ng/mL — ABNORMAL LOW (ref 16–154)
Iron: 46 ug/dL (ref 27–164)
TIBC: 461 mcg/dL (calc) — ABNORMAL HIGH (ref 271–448)

## 2021-09-12 LAB — HEPATITIS C ANTIBODY: Hepatitis C Ab: NONREACTIVE

## 2021-09-14 NOTE — Addendum Note (Signed)
Addended by: Lorre Munroe on: 09/14/2021 09:35 AM   Modules accepted: Orders

## 2021-09-19 ENCOUNTER — Inpatient Hospital Stay: Payer: Medicaid Other

## 2021-09-19 ENCOUNTER — Other Ambulatory Visit: Payer: Medicaid Other

## 2021-09-19 ENCOUNTER — Encounter: Payer: Medicaid Other | Admitting: Internal Medicine

## 2021-09-19 ENCOUNTER — Inpatient Hospital Stay: Payer: Medicaid Other | Attending: Internal Medicine | Admitting: Internal Medicine

## 2021-09-19 ENCOUNTER — Encounter: Payer: Self-pay | Admitting: Internal Medicine

## 2021-09-19 DIAGNOSIS — K5900 Constipation, unspecified: Secondary | ICD-10-CM | POA: Insufficient documentation

## 2021-09-19 DIAGNOSIS — J45909 Unspecified asthma, uncomplicated: Secondary | ICD-10-CM | POA: Insufficient documentation

## 2021-09-19 DIAGNOSIS — R5383 Other fatigue: Secondary | ICD-10-CM | POA: Diagnosis not present

## 2021-09-19 DIAGNOSIS — D509 Iron deficiency anemia, unspecified: Secondary | ICD-10-CM | POA: Diagnosis present

## 2021-09-19 DIAGNOSIS — Z62819 Personal history of unspecified abuse in childhood: Secondary | ICD-10-CM | POA: Diagnosis not present

## 2021-09-19 NOTE — Progress Notes (Signed)
Patient here today for initial visit regarding anemia. Patient reports fatigue.
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
## 2021-09-19 NOTE — Addendum Note (Signed)
Addended byMichaelyn Barter on: 09/19/2021 06:08 PM   Modules accepted: Level of Service

## 2021-09-22 ENCOUNTER — Inpatient Hospital Stay: Payer: Medicaid Other | Attending: Internal Medicine

## 2021-09-22 VITALS — BP 118/64 | HR 84 | Temp 97.6°F | Resp 18

## 2021-09-22 DIAGNOSIS — D509 Iron deficiency anemia, unspecified: Secondary | ICD-10-CM | POA: Diagnosis present

## 2021-09-22 DIAGNOSIS — Z79899 Other long term (current) drug therapy: Secondary | ICD-10-CM | POA: Diagnosis not present

## 2021-09-22 MED ORDER — SODIUM CHLORIDE 0.9 % IV SOLN
510.0000 mg | Freq: Once | INTRAVENOUS | Status: AC
  Administered 2021-09-22: 510 mg via INTRAVENOUS
  Filled 2021-09-22: qty 510

## 2021-09-22 MED ORDER — SODIUM CHLORIDE 0.9 % IV SOLN
Freq: Once | INTRAVENOUS | Status: AC
  Filled 2021-09-22: qty 250

## 2021-09-22 NOTE — Patient Instructions (Signed)
[facility]  Discharge Instructions: Thank you for choosing [facility] to provide your oncology and hematology care.  If you have a lab appointment with the Cancer Center, please go directly to the Cancer Center and check in at the registration area.  Wear comfortable clothing and clothing appropriate for easy access to any Portacath or PICC line.   We strive to give you quality time with your provider. You may need to reschedule your appointment if you arrive late (15 or more minutes).  Arriving late affects you and other patients whose appointments are after yours.  Also, if you miss three or more appointments without notifying the office, you may be dismissed from the clinic at the provider's discretion.      For prescription refill requests, have your pharmacy contact our office and allow 72 hours for refills to be completed.    Today you received the following chemotherapy and/or immunotherapy agents VENOFER      To help prevent nausea and vomiting after your treatment, we encourage you to take your nausea medication as directed.  BELOW ARE SYMPTOMS THAT SHOULD BE REPORTED IMMEDIATELY: *FEVER GREATER THAN 100.4 F (38 C) OR HIGHER *CHILLS OR SWEATING *NAUSEA AND VOMITING THAT IS NOT CONTROLLED WITH YOUR NAUSEA MEDICATION *UNUSUAL SHORTNESS OF BREATH *UNUSUAL BRUISING OR BLEEDING *URINARY PROBLEMS (pain or burning when urinating, or frequent urination) *BOWEL PROBLEMS (unusual diarrhea, constipation, pain near the anus) TENDERNESS IN MOUTH AND THROAT WITH OR WITHOUT PRESENCE OF ULCERS (sore throat, sores in mouth, or a toothache) UNUSUAL RASH, SWELLING OR PAIN  UNUSUAL VAGINAL DISCHARGE OR ITCHING   Items with * indicate a potential emergency and should be followed up as soon as possible or go to the Emergency Department if any problems should occur.  Please show the CHEMOTHERAPY ALERT CARD or IMMUNOTHERAPY ALERT CARD at check-in to the  Emergency Department and triage nurse.  Should you have questions after your visit or need to cancel or reschedule your appointment, please contact [facility]  [phone] and follow the prompts.  Office hours are 8:00 a.m. to 4:30 p.m. Monday - Friday. Please note that voicemails left after 4:00 p.m. may not be returned until the following business day.  We are closed weekends and major holidays. You have access to a nurse at all times for urgent questions. Please call the main number to the clinic [phone] and follow the prompts.  For any non-urgent questions, you may also contact your provider using MyChart. We now offer e-Visits for anyone 18 and older to request care online for non-urgent symptoms. For details visit [email or web address].   Also download the MyChart app! Go to the app store, search "MyChart", open the app, select [facility], and log in with your MyChart username and password.  Masks are optional in the cancer centers. If you would like for your care team to wear a mask while they are taking care of you, please let them know. For doctor visits, patients may have with them one support person who is at least 20 years old. At this time, visitors are not allowed in the infusion area.  Iron Sucrose Injection What is this medication? IRON SUCROSE (EYE ern SOO krose) treats low levels of iron (iron deficiency anemia) in people with kidney disease. Iron is a mineral that plays an important role in making red blood cells, which carry oxygen from your lungs to the rest of your body. This medicine may be   used for other purposes; ask your health care provider or pharmacist if you have questions. COMMON BRAND NAME(S): Venofer What should I tell my care team before I take this medication? They need to know if you have any of these conditions: Anemia not caused by low iron levels Heart disease High levels of iron in the blood Kidney disease Liver  disease An unusual or allergic reaction to iron, other medications, foods, dyes, or preservatives Pregnant or trying to get pregnant Breast-feeding How should I use this medication? This medication is for infusion into a vein. It is given in a hospital or clinic setting. Talk to your care team about the use of this medication in children. While this medication may be prescribed for children as young as 2 years for selected conditions, precautions do apply. Overdosage: If you think you have taken too much of this medicine contact a poison control center or emergency room at once. NOTE: This medicine is only for you. Do not share this medicine with others. What if I miss a dose? It is important not to miss your dose. Call your care team if you are unable to keep an appointment. What may interact with this medication? Do not take this medication with any of the following: Deferoxamine Dimercaprol Other iron products This medication may also interact with the following: Chloramphenicol Deferasirox This list may not describe all possible interactions. Give your health care provider a list of all the medicines, herbs, non-prescription drugs, or dietary supplements you use. Also tell them if you smoke, drink alcohol, or use illegal drugs. Some items may interact with your medicine. What should I watch for while using this medication? Visit your care team regularly. Tell your care team if your symptoms do not start to get better or if they get worse. You may need blood work done while you are taking this medication. You may need to follow a special diet. Talk to your care team. Foods that contain iron include: whole grains/cereals, dried fruits, beans, or peas, leafy green vegetables, and organ meats (liver, kidney). What side effects may I notice from receiving this medication? Side effects that you should report to your care team as soon as possible: Allergic reactions--skin rash, itching, hives,  swelling of the face, lips, tongue, or throat Low blood pressure--dizziness, feeling faint or lightheaded, blurry vision Shortness of breath Side effects that usually do not require medical attention (report to your care team if they continue or are bothersome): Flushing Headache Joint pain Muscle pain Nausea Pain, redness, or irritation at injection site This list may not describe all possible side effects. Call your doctor for medical advice about side effects. You may report side effects to FDA at 1-800-FDA-1088. Where should I keep my medication? This medication is given in a hospital or clinic and will not be stored at home. NOTE: This sheet is a summary. It may not cover all possible information. If you have questions about this medicine, talk to your doctor, pharmacist, or health care provider.  2023 Elsevier/Gold Standard (2007-03-01 00:00:00)   

## 2021-10-06 ENCOUNTER — Inpatient Hospital Stay: Payer: Medicaid Other

## 2021-10-06 VITALS — BP 130/83 | HR 73 | Temp 96.8°F | Resp 18

## 2021-10-06 DIAGNOSIS — D509 Iron deficiency anemia, unspecified: Secondary | ICD-10-CM

## 2021-10-06 MED ORDER — SODIUM CHLORIDE 0.9 % IV SOLN
510.0000 mg | Freq: Once | INTRAVENOUS | Status: AC
  Administered 2021-10-06: 510 mg via INTRAVENOUS
  Filled 2021-10-06: qty 510

## 2021-10-06 MED ORDER — SODIUM CHLORIDE 0.9 % IV SOLN
Freq: Once | INTRAVENOUS | Status: AC
  Filled 2021-10-06: qty 250

## 2021-10-30 ENCOUNTER — Ambulatory Visit
Admission: RE | Admit: 2021-10-30 | Discharge: 2021-10-30 | Disposition: A | Discharge status: Home / Self Care | Payer: Medicaid Other | Source: Ambulatory Visit | Attending: Internal Medicine | Admitting: Internal Medicine

## 2021-10-30 ENCOUNTER — Encounter: Payer: Self-pay | Admitting: Internal Medicine

## 2021-10-30 ENCOUNTER — Ambulatory Visit (INDEPENDENT_AMBULATORY_CARE_PROVIDER_SITE_OTHER): Payer: Medicaid Other | Admitting: Internal Medicine

## 2021-10-30 ENCOUNTER — Ambulatory Visit
Admission: RE | Admit: 2021-10-30 | Discharge: 2021-10-30 | Disposition: A | Discharge status: Home / Self Care | Payer: Medicaid Other | Attending: Internal Medicine | Admitting: Internal Medicine

## 2021-10-30 VITALS — BP 119/64 | HR 72 | Ht 71.5 in | Wt 219.0 lb

## 2021-10-30 DIAGNOSIS — F3341 Major depressive disorder, recurrent, in partial remission: Secondary | ICD-10-CM | POA: Diagnosis not present

## 2021-10-30 DIAGNOSIS — S90851A Superficial foreign body, right foot, initial encounter: Secondary | ICD-10-CM | POA: Diagnosis present

## 2021-10-30 DIAGNOSIS — M79671 Pain in right foot: Secondary | ICD-10-CM | POA: Diagnosis present

## 2021-10-30 DIAGNOSIS — F902 Attention-deficit hyperactivity disorder, combined type: Secondary | ICD-10-CM

## 2021-10-30 NOTE — Assessment & Plan Note (Signed)
Referral to psychiatry per patient request 

## 2021-10-30 NOTE — Assessment & Plan Note (Signed)
Referral to psychiatry per patient request
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
## 2021-10-30 NOTE — Patient Instructions (Signed)
Corns and Calluses Corns are small areas of thickened skin that form on the top, sides, or tip of a toe. Corns have a cone-shaped core with a point that can press on a nerve below. This causes pain. Calluses are areas of thickened skin that can form anywhere on the body, including the hands, fingers, palms, soles of the feet, and heels. Calluses are usually larger than corns. What are the causes? Corns and calluses are caused by rubbing (friction) or pressure, such as from shoes that are too tight or do not fit properly. What increases the risk? Corns are more likely to develop in people who have misshapen toes (toe deformities), such as hammer toes. Calluses can form with friction to any area of the skin. They are more likely to develop in people who: Work with their hands. Wear shoes that fit poorly, are too tight, or are high-heeled. Have toe deformities. What are the signs or symptoms? Symptoms of a corn or callus include: A hard growth on the skin. Pain or tenderness under the skin. Redness and swelling. Increased discomfort while wearing tight-fitting shoes, if your feet are affected. If a corn or callus becomes infected, symptoms may include: Redness and swelling that gets worse. Pain. Fluid, blood, or pus draining from the corn or callus. How is this diagnosed? Corns and calluses may be diagnosed based on your symptoms, your medical history, and a physical exam. How is this treated? Treatment for corns and calluses may include: Removing the cause of the friction or pressure. This may involve: Changing your shoes. Wearing shoe inserts (orthotics) or other protective layers in your shoes, such as a corn pad. Wearing gloves. Applying medicine to the skin (topical medicine) to help soften skin in the hardened, thickened areas. Removing layers of dead skin with a file to reduce the size of the corn or callus. Removing the corn or callus with a scalpel or laser. Taking antibiotic  medicines, if your corn or callus is infected. Having surgery, if a toe deformity is the cause. Follow these instructions at home:  Take over-the-counter and prescription medicines only as told by your health care provider. If you were prescribed an antibiotic medicine, take it as told by your health care provider. Do not stop taking it even if your condition improves. Wear shoes that fit well. Avoid wearing high-heeled shoes and shoes that are too tight or too loose. Wear any padding, protective layers, gloves, or orthotics as told by your health care provider. Soak your hands or feet. Then use a file or pumice stone to soften your corn or callus. Do this as told by your health care provider. Check your corn or callus every day for signs of infection. Contact a health care provider if: Your symptoms do not improve with treatment. You have redness or swelling that gets worse. Your corn or callus becomes painful. You have fluid, blood, or pus coming from your corn or callus. You have new symptoms. Get help right away if: You develop severe pain with redness. Summary Corns are small areas of thickened skin that form on the top, sides, or tip of a toe. These can be painful. Calluses are areas of thickened skin that can form anywhere on the body, including the hands, fingers, palms, and soles of the feet. Calluses are usually larger than corns. Corns and calluses are caused by rubbing (friction) or pressure, such as from shoes that are too tight or do not fit properly. Treatment may include wearing padding, protective   layers, gloves, or orthotics as told by your health care provider. This information is not intended to replace advice given to you by your health care provider. Make sure you discuss any questions you have with your health care provider. Document Revised: 05/07/2019 Document Reviewed: 05/07/2019 Elsevier Patient Education  2023 Elsevier Inc.  

## 2021-12-20 ENCOUNTER — Inpatient Hospital Stay: Payer: Medicaid Other | Attending: Internal Medicine

## 2021-12-20 ENCOUNTER — Telehealth: Payer: Self-pay | Admitting: *Deleted

## 2021-12-20 DIAGNOSIS — J45909 Unspecified asthma, uncomplicated: Secondary | ICD-10-CM | POA: Insufficient documentation

## 2021-12-20 DIAGNOSIS — R5383 Other fatigue: Secondary | ICD-10-CM | POA: Diagnosis not present

## 2021-12-20 DIAGNOSIS — D509 Iron deficiency anemia, unspecified: Secondary | ICD-10-CM | POA: Diagnosis present

## 2021-12-20 DIAGNOSIS — K5900 Constipation, unspecified: Secondary | ICD-10-CM | POA: Diagnosis not present

## 2021-12-20 LAB — CBC WITH DIFFERENTIAL/PLATELET
Abs Immature Granulocytes: 0.01 10*3/uL (ref 0.00–0.07)
Basophils Absolute: 0 10*3/uL (ref 0.0–0.1)
Basophils Relative: 1 %
Eosinophils Absolute: 0.1 10*3/uL (ref 0.0–0.5)
Eosinophils Relative: 2 %
HCT: 42.2 % (ref 36.0–46.0)
Hemoglobin: 14.3 g/dL (ref 12.0–15.0)
Immature Granulocytes: 0 %
Lymphocytes Relative: 32 %
Lymphs Abs: 1.2 10*3/uL (ref 0.7–4.0)
MCH: 31.6 pg (ref 26.0–34.0)
MCHC: 33.9 g/dL (ref 30.0–36.0)
MCV: 93.2 fL (ref 80.0–100.0)
Monocytes Absolute: 0.2 10*3/uL (ref 0.1–1.0)
Monocytes Relative: 6 %
Neutro Abs: 2.3 10*3/uL (ref 1.7–7.7)
Neutrophils Relative %: 59 %
Platelets: 259 10*3/uL (ref 150–400)
RBC: 4.53 MIL/uL (ref 3.87–5.11)
RDW: 12.4 % (ref 11.5–15.5)
WBC: 3.8 10*3/uL — ABNORMAL LOW (ref 4.0–10.5)
nRBC: 0 % (ref 0.0–0.2)

## 2021-12-20 LAB — IRON AND TIBC
Iron: 128 ug/dL (ref 28–170)
Saturation Ratios: 33 % — ABNORMAL HIGH (ref 10.4–31.8)
TIBC: 386 ug/dL (ref 250–450)
UIBC: 258 ug/dL

## 2021-12-20 LAB — FERRITIN: Ferritin: 7 ng/mL — ABNORMAL LOW (ref 11–307)

## 2021-12-20 LAB — TSH: TSH: 1.885 u[IU]/mL (ref 0.350–4.500)

## 2021-12-20 NOTE — Telephone Encounter (Signed)
Patient mother called asking for lab results form this morning  Ferritin Order: [id number] Status: Final result     Visible to patient: No (scheduled for 12/20/2021  3:44 PM)     Next appt: 12/21/2021 at 01:30 PM in Oncology Michaelyn Barter, MD)     Dx: Iron deficiency anemia, unspecified i...   0 Result Notes       Component Ref Range & Units 13:14 3 mo ago 7 mo ago  Ferritin 11 - 307 ng/mL 7 Low  10 Low  R 21 R  Comment: Performed at [facility], [address] [place], [place] [address]  Resulting Agency  [facility] [place] Abstrct Entry         Specimen Collected: 12/20/21 13:14 Last Resulted: 12/20/21 14:44      Lab Flowsheet      Order Details      View Encounter      Lab and Collection Details      Routing      Result History    View All Conversations on this Encounter      R=Reference range differs from displayed range      Result Care Coordination   Patient Communication   12/20/2021  3:44 PM Release Now   Not seen Back to Top      Other Results from 12/20/2021  TSH Order: [id number] Status: Final result      Visible to patient: No (scheduled for 12/20/2021  3:44 PM)      Next appt: 12/21/2021 at 01:30 PM in Oncology Michaelyn Barter, MD)      Dx: Iron deficiency anemia, unspecified i...    0 Result Notes       Component Ref Range & Units 13:14  TSH 0.350 - 4.500 uIU/mL 1.885  Comment: Performed by a 3rd Generation assay with a functional sensitivity of <=0.01 uIU/mL. Performed at [facility], [address] [place], [place] [address]  Resulting Agency  [facility] CLIN LAB         Specimen Collected: 12/20/21 13:14 Last Resulted: 12/20/21 14:44      Lab Flowsheet       Order Details       View Encounter       Lab and Collection Details       Routing       Result History     View All Conversations on this Encounter        Result Care Coordination   Patient Communication    12/20/2021  3:44 PM Release Now   Not seen Back to Top         Contains abnormal data Iron and TIBC([facility]) Order: [id number] Status: Final result      Visible to patient: No (scheduled for 12/20/2021  3:44 PM)      Next appt: 12/21/2021 at 01:30 PM in Oncology Michaelyn Barter, MD)      Dx: Iron deficiency anemia, unspecified i...    0 Result Notes         Component Ref Range & Units 13:14 3 mo ago 7 mo ago  Iron 28 - 170 ug/dL 892 46 R 33 R  TIBC 119 - 450 ug/dL 417 408 High  R 144 R  Saturation Ratios 10.4 - 31.8 % 33 High     UIBC ug/dL 818  563 R  Comment: Performed at [facility], [address] [place], [place] [address]  Resulting Agency  [facility]  [place] Abstrct Entry         Specimen Collected: 12/20/21 13:14 Last Resulted: 12/20/21 14:44      Lab Flowsheet       Order Details       View Encounter       Lab and Collection Details       Routing       Result History     View All Conversations on this Encounter      R=Reference range differs from displayed range      Result Care Coordination   Patient Communication   12/20/2021  3:44 PM Release Now   Not seen Back to Top         Contains abnormal data CBC with Differential Order: [id number] Status: Final result      Visible to patient: Yes (seen)      Next appt: 12/21/2021 at 01:30 PM in Oncology Michaelyn Barter, MD)      Dx: Iron deficiency anemia, unspecified i...    0 Result Notes             Component Ref Range & Units 13:14 (12/20/21) 3 mo ago (09/11/21) 7 mo ago (05/10/21) 7 mo ago (05/10/21) 7 mo ago (05/06/21) 7 mo ago (05/05/21) 10 mo ago (02/16/21)  WBC 4.0 - 10.5 K/uL 3.8 Low  4.5 R  4.4 R 6.6 13.1 High  3.8 R  RBC 3.87 - 5.11 MIL/uL 4.53 3.64 Low  R 3.7 Abnormal  R  3.21 Low  3.76 Low  3.49 Low  R  Hemoglobin 12.0 - 15.0 g/dL 62.2 29.7 Low  R  98.9 Abnormal  R 8.9 Low  10.6 Low  10.1 Low  R  HCT 36.0 - 46.0 % 42.2 31.9  Low  R  32 Abnormal  R 28.8 Low  36.0 31.5 Low  R  MCV 80.0 - 100.0 fL 93.2 87.6   89.7 95.7 90.3  MCH 26.0 - 34.0 pg 31.6 27.5 R   27.7 28.2 28.9 R  MCHC 30.0 - 36.0 g/dL 21.1 94.1 Low  R   74.0 29.4 Low  32.1 R  RDW 11.5 - 15.5 % 12.4 13.7 R   14.0 13.8 13.8 R  Platelets 150 - 400 K/uL 259 286 R  327 255 378 282 R  nRBC 0.0 - 0.2 % 0.0    0.0 CM 0.0 CM   Neutrophils Relative % % 59        Neutro Abs 1.7 - 7.7 K/uL 2.3        Lymphocytes Relative % 32        Lymphs Abs 0.7 - 4.0 K/uL 1.2        Monocytes Relative % 6        Monocytes Absolute 0.1 - 1.0 K/uL 0.2        Eosinophils Relative % 2        Eosinophils Absolute 0.0 - 0.5 K/uL 0.1        Basophils Relative % 1        Basophils Absolute 0.0 - 0.1 K/uL 0.0        Immature Granulocytes % 0        Abs Immature Granulocytes 0.00 - 0.07 K/uL 0.01        Comment: Performed at [facility], [address] [place], [place] [address]  MPV   9.4 R     10.6 R  Resulting Agency  [facility] [place] Abstrct Entry Abstrct  Entry [facility] [place]         Specimen Collected: 12/20/21 13:14 Last Resulted: 12/20/21 13:40

## 2021-12-21 ENCOUNTER — Inpatient Hospital Stay (HOSPITAL_BASED_OUTPATIENT_CLINIC_OR_DEPARTMENT_OTHER): Payer: Medicaid Other | Admitting: Internal Medicine

## 2021-12-21 ENCOUNTER — Encounter: Payer: Self-pay | Admitting: Internal Medicine

## 2021-12-21 VITALS — BP 125/75 | HR 74 | Temp 96.6°F | Resp 20 | Wt 221.5 lb

## 2021-12-21 DIAGNOSIS — R5383 Other fatigue: Secondary | ICD-10-CM | POA: Diagnosis not present

## 2021-12-21 DIAGNOSIS — D509 Iron deficiency anemia, unspecified: Secondary | ICD-10-CM | POA: Diagnosis not present

## 2021-12-21 NOTE — Addendum Note (Signed)
Addended byMichaelyn Barter on: 12/21/2021 02:40 PM   Modules accepted: Orders
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
## 2021-12-21 NOTE — Progress Notes (Signed)
Patient states she is very fatigued.

## 2021-12-25 ENCOUNTER — Encounter: Payer: Self-pay | Admitting: Internal Medicine

## 2021-12-27 ENCOUNTER — Inpatient Hospital Stay: Payer: Medicaid Other | Attending: Internal Medicine

## 2021-12-27 VITALS — BP 127/72 | HR 75 | Temp 97.1°F | Resp 16

## 2021-12-27 DIAGNOSIS — D509 Iron deficiency anemia, unspecified: Secondary | ICD-10-CM | POA: Diagnosis present

## 2021-12-27 DIAGNOSIS — Z79899 Other long term (current) drug therapy: Secondary | ICD-10-CM | POA: Insufficient documentation

## 2021-12-27 MED ORDER — SODIUM CHLORIDE 0.9 % IV SOLN
Freq: Once | INTRAVENOUS | Status: AC
  Filled 2021-12-27: qty 250

## 2021-12-27 MED ORDER — SODIUM CHLORIDE 0.9 % IV SOLN
510.0000 mg | Freq: Once | INTRAVENOUS | Status: AC
  Administered 2021-12-27: 510 mg via INTRAVENOUS
  Filled 2021-12-27: qty 510

## 2022-01-03 ENCOUNTER — Inpatient Hospital Stay: Payer: Medicaid Other

## 2022-01-03 VITALS — BP 103/66 | HR 70 | Resp 16

## 2022-01-03 DIAGNOSIS — D509 Iron deficiency anemia, unspecified: Secondary | ICD-10-CM

## 2022-01-03 MED ORDER — SODIUM CHLORIDE 0.9 % IV SOLN
510.0000 mg | Freq: Once | INTRAVENOUS | Status: AC
  Administered 2022-01-03: 510 mg via INTRAVENOUS
  Filled 2022-01-03: qty 510

## 2022-01-03 MED ORDER — SODIUM CHLORIDE 0.9 % IV SOLN
Freq: Once | INTRAVENOUS | Status: AC
  Filled 2022-01-03: qty 250

## 2022-01-03 NOTE — Patient Instructions (Signed)

## 2022-01-04 ENCOUNTER — Encounter: Payer: Self-pay | Admitting: Gastroenterology

## 2022-01-04 ENCOUNTER — Ambulatory Visit (INDEPENDENT_AMBULATORY_CARE_PROVIDER_SITE_OTHER): Payer: Medicaid Other | Admitting: Gastroenterology

## 2022-01-04 VITALS — BP 109/64 | HR 79 | Temp 98.5°F | Ht 71.5 in | Wt 222.0 lb

## 2022-01-04 DIAGNOSIS — D509 Iron deficiency anemia, unspecified: Secondary | ICD-10-CM

## 2022-01-04 DIAGNOSIS — K5900 Constipation, unspecified: Secondary | ICD-10-CM

## 2022-01-04 MED ORDER — LINACLOTIDE 290 MCG PO CAPS: 290.0000 ug | ORAL_CAPSULE | Freq: Every day | ORAL | 0 refills | Status: DC

## 2022-01-04 MED ORDER — NA SULFATE-K SULFATE-MG SULF 17.5-3.13-1.6 GM/177ML PO SOLN: 1.0000 | Freq: Once | ORAL | 0 refills | Status: AC
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
## 2022-01-07 LAB — CELIAC DISEASE PANEL
Endomysial IgA: NEGATIVE
IgA/Immunoglobulin A, Serum: 148 mg/dL (ref 87–352)
Transglutaminase IgA: <2 U/mL (ref 0–3)

## 2022-01-08 ENCOUNTER — Encounter: Payer: Self-pay | Admitting: Gastroenterology

## 2022-01-23 ENCOUNTER — Ambulatory Visit: Payer: Medicaid Other | Admitting: Certified Registered Nurse Anesthetist

## 2022-01-23 ENCOUNTER — Ambulatory Visit
Admission: RE | Admit: 2022-01-23 | Discharge: 2022-01-23 | Disposition: A | Discharge status: Home / Self Care | Payer: Medicaid Other | Attending: Gastroenterology | Admitting: Gastroenterology

## 2022-01-23 ENCOUNTER — Encounter
Admission: RE | Disposition: A | Discharge status: Home / Self Care | Payer: Self-pay | Source: Home / Self Care | Attending: Gastroenterology

## 2022-01-23 ENCOUNTER — Encounter: Payer: Self-pay | Admitting: Gastroenterology

## 2022-01-23 DIAGNOSIS — J45909 Unspecified asthma, uncomplicated: Secondary | ICD-10-CM | POA: Diagnosis not present

## 2022-01-23 DIAGNOSIS — K5909 Other constipation: Secondary | ICD-10-CM | POA: Diagnosis not present

## 2022-01-23 DIAGNOSIS — K635 Polyp of colon: Secondary | ICD-10-CM | POA: Insufficient documentation

## 2022-01-23 DIAGNOSIS — K640 First degree hemorrhoids: Secondary | ICD-10-CM | POA: Diagnosis not present

## 2022-01-23 DIAGNOSIS — D509 Iron deficiency anemia, unspecified: Secondary | ICD-10-CM | POA: Diagnosis not present

## 2022-01-23 HISTORY — PX: ESOPHAGOGASTRODUODENOSCOPY: SHX5428

## 2022-01-23 HISTORY — PX: COLONOSCOPY WITH PROPOFOL: SHX5780

## 2022-01-23 SURGERY — COLONOSCOPY WITH PROPOFOL
Anesthesia: General

## 2022-01-23 MED ORDER — GLYCOPYRROLATE 0.2 MG/ML IJ SOLN
INTRAMUSCULAR | Status: DC | PRN
  Administered 2022-01-23: .2 mg via INTRAVENOUS

## 2022-01-23 MED ORDER — SODIUM CHLORIDE 0.9 % IV SOLN: INTRAVENOUS | Status: DC

## 2022-01-23 MED ORDER — LIDOCAINE HCL (PF) 2 % IJ SOLN
INTRAMUSCULAR | Status: AC
  Filled 2022-01-23: qty 5

## 2022-01-23 MED ORDER — EPHEDRINE SULFATE (PRESSORS) 50 MG/ML IJ SOLN
INTRAMUSCULAR | Status: DC | PRN
  Administered 2022-01-23: 5 mg via INTRAVENOUS

## 2022-01-23 MED ORDER — EPHEDRINE 5 MG/ML INJ
INTRAVENOUS | Status: AC
  Filled 2022-01-23: qty 5

## 2022-01-23 MED ORDER — GLYCOPYRROLATE 0.2 MG/ML IJ SOLN
INTRAMUSCULAR | Status: AC
  Filled 2022-01-23: qty 1

## 2022-01-23 MED ORDER — LIDOCAINE HCL (CARDIAC) PF 100 MG/5ML IV SOSY
PREFILLED_SYRINGE | INTRAVENOUS | Status: DC | PRN
  Administered 2022-01-23: 100 mg via INTRAVENOUS

## 2022-01-23 MED ORDER — PROPOFOL 500 MG/50ML IV EMUL
INTRAVENOUS | Status: DC | PRN
  Administered 2022-01-23: 150 ug/kg/min via INTRAVENOUS

## 2022-01-23 MED ORDER — PROPOFOL 1000 MG/100ML IV EMUL
INTRAVENOUS | Status: AC
  Filled 2022-01-23: qty 400

## 2022-01-23 MED ORDER — PROPOFOL 10 MG/ML IV BOLUS
INTRAVENOUS | Status: DC | PRN
  Administered 2022-01-23: 20 mg via INTRAVENOUS
  Administered 2022-01-23: 60 mg via INTRAVENOUS

## 2022-01-23 MED ORDER — LINACLOTIDE 290 MCG PO CAPS: 290.0000 ug | ORAL_CAPSULE | Freq: Every day | ORAL | 11 refills | Status: DC

## 2022-01-23 NOTE — Anesthesia Procedure Notes (Signed)
Date/Time: 01/23/2022 9:56 AM  Performed by: Demetrius Charity, CRNAPre-anesthesia Checklist: Patient identified, Emergency Drugs available, Suction available, Patient being monitored and Timeout performed Patient Re-evaluated:Patient Re-evaluated prior to induction Oxygen Delivery Method: Nasal cannula Induction Type: IV induction Placement Confirmation: CO2 detector and positive ETCO2
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
## 2022-01-23 NOTE — Transfer of Care (Signed)
Immediate Anesthesia Transfer of Care Note  Patient: Deborah Rhodes  Procedure(s) Performed: COLONOSCOPY WITH PROPOFOL ESOPHAGOGASTRODUODENOSCOPY (EGD)  Patient Location: PACU  Anesthesia Type:General  Level of Consciousness: drowsy  Airway & Oxygen Therapy: Patient Spontanous Breathing  Post-op Assessment: Report given to RN and Post -op Vital signs reviewed and stable  Post vital signs: Reviewed and stable  Last Vitals:  Vitals Value Taken Time  BP    Temp    Pulse    Resp    SpO2      Last Pain:  Vitals:   01/23/22 0834  TempSrc: Temporal  PainSc: 0-No pain         Complications: No notable events documented.

## 2022-01-23 NOTE — Op Note (Signed)
[facility] Gastroenterology Patient Name: Deborah Rhodes Procedure Date: 01/23/2022 9:50 AM MRN: [id number] Account #: [id number] Date of Birth: 01-Sep-2000 Admit Type: Outpatient Age: 22 Room: [facility] ENDO ROOM 4 Gender: Female Note Status: Finalized Instrument Name: Upper Endoscope [id number] Procedure:             Upper GI endoscopy Indications:           Iron deficiency anemia Providers:             Lucilla Lame MD, MD Referring MD:          Jearld Fenton (Referring MD) Complications:         No immediate complications. Procedure:             Pre-Anesthesia Assessment:                        - Prior to the procedure, a History and Physical was                         performed, and patient medications and allergies were                         reviewed. The patient's tolerance of previous                         anesthesia was also reviewed. The risks and benefits                         of the procedure and the sedation options and risks                         were discussed with the patient. All questions were                         answered, and informed consent was obtained. Prior                         Anticoagulants: The patient has taken no anticoagulant                         or antiplatelet agents. ASA Grade Assessment: II - A                         patient with mild systemic disease. After reviewing                         the risks and benefits, the patient was deemed in                         satisfactory condition to undergo the procedure.                        After obtaining informed consent, the endoscope was                         passed under direct vision. Throughout the procedure,                         the patient's  blood pressure, pulse, and oxygen                         saturations were monitored continuously. The Endoscope                         was introduced through the mouth, and advanced to the                         second  part of duodenum. The upper GI endoscopy was                         accomplished without difficulty. The patient tolerated                         the procedure well. Findings:      The esophagus was normal.      The stomach was normal.      The examined duodenum was normal. Impression:            - Normal esophagus.                        - Normal stomach.                        - Normal examined duodenum.                        - No specimens collected. Recommendation:        - Discharge patient to home.                        - Resume previous diet.                        - Continue present medications.                        - Perform a colonoscopy today. Procedure Code(s):     --- Professional ---                        [phone], Esophagogastroduodenoscopy, flexible,                         transoral; diagnostic, including collection of                         specimen(s) by brushing or washing, when performed                         (separate procedure) Diagnosis Code(s):     --- Professional ---                        D50.9, Iron deficiency anemia, unspecified CPT copyright 2022 American Medical Association. All rights reserved. The codes documented in this report are preliminary and upon coder review may  be revised to meet current compliance requirements. Lucilla Lame MD, MD 01/23/2022 10:13:39 AM This report has been signed electronically. Number of Addenda: 0 Note Initiated On: 01/23/2022 9:50 AM Estimated Blood Loss:  Estimated blood loss: none.  [facility]

## 2022-01-23 NOTE — Anesthesia Postprocedure Evaluation (Signed)
Anesthesia Post Note  Patient: Deborah Rhodes  Procedure(s) Performed: COLONOSCOPY WITH PROPOFOL ESOPHAGOGASTRODUODENOSCOPY (EGD)  Patient location during evaluation: Endoscopy Anesthesia Type: General Level of consciousness: awake and alert Pain management: pain level controlled Vital Signs Assessment: post-procedure vital signs reviewed and stable Respiratory status: spontaneous breathing, nonlabored ventilation, respiratory function stable and patient connected to nasal cannula oxygen Cardiovascular status: blood pressure returned to baseline and stable Postop Assessment: no apparent nausea or vomiting Anesthetic complications: no  No notable events documented.   Last Vitals:  Vitals:   01/23/22 0834  BP: 110/76  Pulse: 65  Resp: 18  Temp: (!) 35.6 C  SpO2: 100%    Last Pain:  Vitals:   01/23/22 0834  TempSrc: Temporal  PainSc: 0-No pain                 Dimas Millin

## 2022-01-23 NOTE — Op Note (Signed)
[facility] Gastroenterology Patient Name: Deborah Rhodes Procedure Date: 01/23/2022 8:37 AM MRN: [id number] Account #: [id number] Date of Birth: 2000/08/13 Admit Type: Outpatient Age: 22 Room: [facility] ENDO ROOM 4 Gender: Female Note Status: Finalized Instrument Name: Park Meo [id number] Procedure:             Colonoscopy Indications:           Iron deficiency anemia Providers:             Lucilla Lame MD, MD Referring MD:          Jearld Fenton (Referring MD) Medicines:             Propofol per Anesthesia Complications:         No immediate complications. Procedure:             Pre-Anesthesia Assessment:                        - Prior to the procedure, a History and Physical was                         performed, and patient medications and allergies were                         reviewed. The patient's tolerance of previous                         anesthesia was also reviewed. The risks and benefits                         of the procedure and the sedation options and risks                         were discussed with the patient. All questions were                         answered, and informed consent was obtained. Prior                         Anticoagulants: The patient has taken no anticoagulant                         or antiplatelet agents. ASA Grade Assessment: II - A                         patient with mild systemic disease. After reviewing                         the risks and benefits, the patient was deemed in                         satisfactory condition to undergo the procedure.                        After obtaining informed consent, the colonoscope was                         passed under direct vision. Throughout the procedure,  the patient's blood pressure, pulse, and oxygen                         saturations were monitored continuously. The                         Colonoscope was introduced through the anus and                          advanced to the the cecum, identified by appendiceal                         orifice and ileocecal valve. The colonoscopy was                         performed without difficulty. The patient tolerated                         the procedure well. The quality of the bowel                         preparation was good. Findings:      The perianal and digital rectal examinations were normal.      A 4 mm polyp was found in the sigmoid colon. The polyp was sessile. The       polyp was removed with a cold biopsy forceps. Resection and retrieval       were complete.      Non-bleeding internal hemorrhoids were found during retroflexion. The       hemorrhoids were Grade I (internal hemorrhoids that do not prolapse). Impression:            - One 4 mm polyp in the sigmoid colon, removed with a                         cold biopsy forceps. Resected and retrieved.                        - Non-bleeding internal hemorrhoids. Recommendation:        - Discharge patient to home.                        - Resume previous diet.                        - Continue present medications.                        - Await pathology results.                        - If the pathology report reveals adenomatous tissue,                         then repeat the colonoscopy for surveillance in 7                         years.                        - To visualize the small bowel,  perform video capsule                         endoscopy at appointment to be scheduled. Procedure Code(s):     --- Professional ---                        [phone], Colonoscopy, flexible; with biopsy, single or                         multiple Diagnosis Code(s):     --- Professional ---                        D50.9, Iron deficiency anemia, unspecified                        D12.5, Benign neoplasm of sigmoid colon CPT copyright 2022 American Medical Association. All rights reserved. The codes documented in this report are preliminary and upon  coder review may  be revised to meet current compliance requirements. Lucilla Lame MD, MD 01/23/2022 10:32:09 AM This report has been signed electronically. Number of Addenda: 0 Note Initiated On: 01/23/2022 8:37 AM Scope Withdrawal Time: 0 hours 9 minutes 48 seconds  Total Procedure Duration: 0 hours 14 minutes 49 seconds  Estimated Blood Loss:  Estimated blood loss: none.      [facility]
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
## 2022-01-24 ENCOUNTER — Encounter: Payer: Self-pay | Admitting: Gastroenterology

## 2022-01-24 ENCOUNTER — Telehealth: Payer: Self-pay

## 2022-01-24 LAB — SURGICAL PATHOLOGY

## 2022-01-24 NOTE — Telephone Encounter (Signed)
This patient needs a capsule for anemia

## 2022-01-30 ENCOUNTER — Other Ambulatory Visit: Payer: Self-pay

## 2022-01-30 DIAGNOSIS — D509 Iron deficiency anemia, unspecified: Secondary | ICD-10-CM

## 2022-02-12 ENCOUNTER — Telehealth: Payer: Self-pay

## 2022-02-12 DIAGNOSIS — F3341 Major depressive disorder, recurrent, in partial remission: Secondary | ICD-10-CM

## 2022-02-12 DIAGNOSIS — F902 Attention-deficit hyperactivity disorder, combined type: Secondary | ICD-10-CM

## 2022-02-12 NOTE — Telephone Encounter (Signed)
At her last visit, her mother advised me that she was seeing a therapist and that she had concerns about ADD and depression and wanting a referral to psychiatry.  Psychiatry offered some medication management.  If she is also seeing a therapist, I am not sure what else she is looking for.

## 2022-02-12 NOTE — Telephone Encounter (Signed)
Copied from [place] [phone]. Topic: Referral - Question >> Feb 12, 2022 11:46 AM Erskine Squibb wrote: Reason for CRM: The mother of the patient called stating she spoke with [facility] and they told her they only do medication management. She states she needs much more than that . She needs to speak with someone as she is having difficulty dealing with grief. The mother is extremely concerned and at her wits end. Please assist patient further

## 2022-02-13 NOTE — Telephone Encounter (Signed)
I spoke with pt's mother (On DPR)  She is needing a new therapist. The one she was with did not work out.  She states [name] is okay with seeing anyone that takes her insurance.    Thanks,   -Mickel Baas

## 2022-02-14 NOTE — Telephone Encounter (Signed)
Referral placed

## 2022-02-14 NOTE — Addendum Note (Signed)
Addended by: Jearld Fenton on: 02/14/2022 07:58 AM   Modules accepted: Orders

## 2022-02-23 ENCOUNTER — Encounter: Payer: Self-pay | Admitting: Emergency Medicine

## 2022-02-23 ENCOUNTER — Ambulatory Visit
Admission: EM | Admit: 2022-02-23 | Discharge: 2022-02-23 | Disposition: A | Discharge status: Home / Self Care | Payer: Medicaid Other

## 2022-02-23 ENCOUNTER — Encounter: Payer: Self-pay | Admitting: Internal Medicine

## 2022-02-23 DIAGNOSIS — H60501 Unspecified acute noninfective otitis externa, right ear: Secondary | ICD-10-CM

## 2022-02-23 DIAGNOSIS — H6501 Acute serous otitis media, right ear: Secondary | ICD-10-CM | POA: Diagnosis not present

## 2022-02-23 MED ORDER — FEXOFENADINE-PSEUDOEPHED ER 60-120 MG PO TB12: 1.0000 | ORAL_TABLET | Freq: Two times a day (BID) | ORAL | 0 refills | Status: DC

## 2022-02-23 MED ORDER — CIPROFLOXACIN-DEXAMETHASONE 0.3-0.1 % OT SUSP: 4.0000 [drp] | Freq: Two times a day (BID) | OTIC | 0 refills | Status: AC

## 2022-02-23 NOTE — ED Triage Notes (Signed)
Patient c/o pain in right ear that started 1-2 weeks ago.  Patient denies any cold symptoms.  Patient denies any injury to right ear.  Patient denies fevers.

## 2022-02-23 NOTE — Discharge Instructions (Addendum)
-  Start the eardrop. - Stop taking your over-the-counter antihistamine to levocetirizine.  Start Allegra-D instead.  If insurance does not cover this medication you may need to pay for it out-of-pocket.  Use Flonase. - This should be improving over the next couple of weeks.  It takes little time for the fluid to dry up. - Put a cottonball in your ear when you shower so the ear does not get wet. - Follow-up with PCP if no improvement over the next couple of weeks or symptoms worsen.
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
## 2022-02-26 ENCOUNTER — Encounter
Admission: RE | Disposition: A | Discharge status: Home / Self Care | Payer: Self-pay | Source: Home / Self Care | Attending: Gastroenterology

## 2022-02-26 ENCOUNTER — Ambulatory Visit: Payer: Medicaid Other | Admitting: Anesthesiology

## 2022-02-26 ENCOUNTER — Ambulatory Visit
Admission: RE | Admit: 2022-02-26 | Discharge: 2022-02-26 | Disposition: A | Discharge status: Home / Self Care | Payer: Medicaid Other | Attending: Gastroenterology | Admitting: Gastroenterology

## 2022-02-26 DIAGNOSIS — D509 Iron deficiency anemia, unspecified: Secondary | ICD-10-CM

## 2022-02-26 HISTORY — PX: GIVENS CAPSULE STUDY: SHX5432

## 2022-02-26 SURGERY — IMAGING PROCEDURE, GI TRACT, INTRALUMINAL, VIA CAPSULE

## 2022-02-27 ENCOUNTER — Encounter: Payer: Self-pay | Admitting: Gastroenterology

## 2022-03-13 ENCOUNTER — Telehealth: Payer: Self-pay | Admitting: Gastroenterology

## 2022-03-13 NOTE — Telephone Encounter (Signed)
Patient's mother calling for results of capsule study. Requesting call back.

## 2022-03-16 NOTE — Telephone Encounter (Signed)
Mother lmovm stating that she is aware of results and pt is doing better at this time

## 2022-03-16 NOTE — Telephone Encounter (Signed)
Left message on voicemail.

## 2022-03-19 ENCOUNTER — Inpatient Hospital Stay: Payer: Medicaid Other | Attending: Internal Medicine

## 2022-03-19 DIAGNOSIS — D509 Iron deficiency anemia, unspecified: Secondary | ICD-10-CM | POA: Insufficient documentation

## 2022-03-19 DIAGNOSIS — R5383 Other fatigue: Secondary | ICD-10-CM | POA: Insufficient documentation

## 2022-03-19 DIAGNOSIS — J45909 Unspecified asthma, uncomplicated: Secondary | ICD-10-CM | POA: Diagnosis not present

## 2022-03-19 DIAGNOSIS — K5900 Constipation, unspecified: Secondary | ICD-10-CM | POA: Insufficient documentation

## 2022-03-19 LAB — CBC WITH DIFFERENTIAL/PLATELET
Abs Immature Granulocytes: 0.01 10*3/uL (ref 0.00–0.07)
Basophils Absolute: 0 10*3/uL (ref 0.0–0.1)
Basophils Relative: 1 %
Eosinophils Absolute: 0.1 10*3/uL (ref 0.0–0.5)
Eosinophils Relative: 2 %
HCT: 40 % (ref 36.0–46.0)
Hemoglobin: 13.6 g/dL (ref 12.0–15.0)
Immature Granulocytes: 0 %
Lymphocytes Relative: 31 %
Lymphs Abs: 1.4 10*3/uL (ref 0.7–4.0)
MCH: 32.3 pg (ref 26.0–34.0)
MCHC: 34 g/dL (ref 30.0–36.0)
MCV: 95 fL (ref 80.0–100.0)
Monocytes Absolute: 0.3 10*3/uL (ref 0.1–1.0)
Monocytes Relative: 6 %
Neutro Abs: 2.6 10*3/uL (ref 1.7–7.7)
Neutrophils Relative %: 60 %
Platelets: 272 10*3/uL (ref 150–400)
RBC: 4.21 MIL/uL (ref 3.87–5.11)
RDW: 11.3 % — ABNORMAL LOW (ref 11.5–15.5)
WBC: 4.3 10*3/uL (ref 4.0–10.5)
nRBC: 0 % (ref 0.0–0.2)

## 2022-03-19 LAB — IRON AND TIBC
Iron: 114 ug/dL (ref 28–170)
Saturation Ratios: 36 % — ABNORMAL HIGH (ref 10.4–31.8)
TIBC: 321 ug/dL (ref 250–450)
UIBC: 207 ug/dL

## 2022-03-19 LAB — FERRITIN: Ferritin: 27 ng/mL (ref 11–307)

## 2022-03-19 LAB — VITAMIN B12: Vitamin B-12: 1144 pg/mL — ABNORMAL HIGH (ref 180–914)

## 2022-03-22 ENCOUNTER — Inpatient Hospital Stay (HOSPITAL_BASED_OUTPATIENT_CLINIC_OR_DEPARTMENT_OTHER): Payer: Medicaid Other | Admitting: Internal Medicine

## 2022-03-22 ENCOUNTER — Encounter: Payer: Self-pay | Admitting: Internal Medicine

## 2022-03-22 VITALS — BP 115/78 | HR 75 | Temp 97.8°F | Resp 20 | Wt 227.2 lb

## 2022-03-22 DIAGNOSIS — D509 Iron deficiency anemia, unspecified: Secondary | ICD-10-CM

## 2022-03-22 DIAGNOSIS — R5383 Other fatigue: Secondary | ICD-10-CM
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
## 2022-03-30 ENCOUNTER — Encounter: Payer: Self-pay | Admitting: Gastroenterology

## 2022-04-26 ENCOUNTER — Other Ambulatory Visit: Payer: Self-pay

## 2022-04-26 ENCOUNTER — Ambulatory Visit
Admission: EM | Admit: 2022-04-26 | Discharge: 2022-04-26 | Disposition: A | Discharge status: Home / Self Care | Payer: Medicaid Other | Attending: Emergency Medicine | Admitting: Emergency Medicine

## 2022-04-26 DIAGNOSIS — J4521 Mild intermittent asthma with (acute) exacerbation: Secondary | ICD-10-CM | POA: Diagnosis not present

## 2022-04-26 DIAGNOSIS — J069 Acute upper respiratory infection, unspecified: Secondary | ICD-10-CM

## 2022-04-26 MED ORDER — PREDNISONE 20 MG PO TABS: 40.0000 mg | ORAL_TABLET | Freq: Every day | ORAL | 0 refills | Status: DC

## 2022-04-26 NOTE — Discharge Instructions (Signed)
Your symptoms today are most likely being caused by a virus and should steadily improve in time it can take up to 7 to 10 days before you truly start to see a turnaround however things will get better, viruses is most likely flaring your asthma  Begin prednisone every morning with food for 5 days to relax the airway which should help settle your shortness of breath, wheezing and cough  You can take Tylenol and/or Ibuprofen as needed for fever reduction and pain relief.   For cough: honey 1/2 to 1 teaspoon (you can dilute the honey in water or another fluid).  You can also use guaifenesin and dextromethorphan for cough. You can use a humidifier for chest congestion and cough.  If you don't have a humidifier, you can sit in the bathroom with the hot shower running.      For sore throat: try warm salt water gargles, cepacol lozenges, throat spray, warm tea or water with lemon/honey, popsicles or ice, or OTC cold relief medicine for throat discomfort.   For congestion: take a daily anti-histamine like Zyrtec, Claritin, and a oral decongestant, such as pseudoephedrine.  You can also use Flonase 1-2 sprays in each nostril daily.   It is important to stay hydrated: drink plenty of fluids (water, gatorade/powerade/pedialyte, juices, or teas) to keep your throat moisturized and help further relieve irritation/discomfort.
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
## 2022-04-26 NOTE — ED Triage Notes (Addendum)
Pt is here with a cough and chest congestion for 5 days now, pt has taken OTC meds to relieve discomfort.

## 2022-05-09 ENCOUNTER — Encounter: Payer: Self-pay | Admitting: Internal Medicine

## 2022-05-09 ENCOUNTER — Ambulatory Visit (INDEPENDENT_AMBULATORY_CARE_PROVIDER_SITE_OTHER): Payer: Medicaid Other | Admitting: Internal Medicine

## 2022-05-09 VITALS — BP 118/82 | HR 77 | Resp 16 | Ht 71.5 in | Wt 228.4 lb

## 2022-05-09 DIAGNOSIS — F902 Attention-deficit hyperactivity disorder, combined type: Secondary | ICD-10-CM | POA: Diagnosis not present

## 2022-05-09 DIAGNOSIS — F3341 Major depressive disorder, recurrent, in partial remission: Secondary | ICD-10-CM

## 2022-05-09 DIAGNOSIS — R519 Headache, unspecified: Secondary | ICD-10-CM

## 2022-05-09 DIAGNOSIS — D508 Other iron deficiency anemias: Secondary | ICD-10-CM | POA: Diagnosis not present

## 2022-05-09 DIAGNOSIS — K5909 Other constipation: Secondary | ICD-10-CM | POA: Diagnosis not present

## 2022-05-09 DIAGNOSIS — Z6831 Body mass index (BMI) 31.0-31.9, adult: Secondary | ICD-10-CM

## 2022-05-09 DIAGNOSIS — E6609 Other obesity due to excess calories: Secondary | ICD-10-CM

## 2022-05-09 DIAGNOSIS — R55 Syncope and collapse: Secondary | ICD-10-CM

## 2022-05-09 MED ORDER — SERTRALINE HCL 100 MG PO TABS: 100.0000 mg | ORAL_TABLET | Freq: Every day | ORAL | 1 refills | Status: DC

## 2022-05-09 NOTE — Assessment & Plan Note (Signed)
She will continue to follow with hematology 

## 2022-05-09 NOTE — Assessment & Plan Note (Signed)
Encourage diet and exercise for weight loss 

## 2022-05-09 NOTE — Assessment & Plan Note (Signed)
Will increase sertraline to 100 mg daily Advised her mother to schedule her an appointment with psychiatry, we have placed referrals in the past Support offered

## 2022-05-09 NOTE — Assessment & Plan Note (Signed)
Encouraged high fiber diet and adequate water intake Continue Linzess as prescribed

## 2022-05-09 NOTE — Assessment & Plan Note (Signed)
Continue Ajovy and Ibuprofen  She will continue to follow with neurology

## 2022-05-09 NOTE — Assessment & Plan Note (Signed)
Not medicated Will monitor 
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
## 2022-05-09 NOTE — Patient Instructions (Signed)

## 2022-05-09 NOTE — Assessment & Plan Note (Signed)
No recurrent slightly We will monitor

## 2022-06-13 ENCOUNTER — Ambulatory Visit: Payer: Medicaid Other

## 2022-07-11 ENCOUNTER — Ambulatory Visit: Payer: Medicaid Other | Admitting: Speech Pathology

## 2022-07-19 ENCOUNTER — Ambulatory Visit: Payer: Medicaid Other | Attending: Neurology | Admitting: Speech Pathology

## 2022-07-19 DIAGNOSIS — R41841 Cognitive communication deficit: Secondary | ICD-10-CM | POA: Diagnosis present

## 2022-07-19 NOTE — Therapy (Signed)
OUTPATIENT SPEECH LANGUAGE PATHOLOGY  EVALUATION   Patient Name: Deborah Rhodes MRN: [id number] DOB:[birth year], 22 y.o., female Today's Date: 07/19/2022  PCP: Sampson Si, NP  REFERRING PROVIDER: Malvin Johns, MD   End of Session - 07/19/22 1212     Visit Number 1    Number of Visits 25    Date for SLP Re-Evaluation 10/11/22    SLP Start Time 0938    SLP Stop Time  1015    SLP Time Calculation (min) 37 min    Activity Tolerance Patient tolerated treatment well             Patient Active Problem List   Diagnosis Date Noted   Iron deficiency anemia 09/19/2021   Frequent headaches 02/16/2021   Chronic constipation 02/16/2021   Recurrent major depressive disorder, in partial remission (HCC) 02/16/2021   Class 1 obesity due to excess calories with body mass index (BMI) of 31.0 to 31.9 in adult 02/16/2021   Syncope, vasovagal 08/19/2017   Attention deficit hyperactivity disorder (ADHD), combined type 08/19/2017    ONSET DATE: 06/13/22  REFERRING DIAG: Cognitive-communication deficits  THERAPY DIAG:  Cognitive communication deficit  Rationale for Evaluation and Treatment Rehabilitation  SUBJECTIVE:   SUBJECTIVE STATEMENT: Pt alert and cooperative. "My speech isn't speeching." "I fumble over my words."  Pt accompanied by: family member, Mother  PERTINENT HISTORY: Pt is a 22 y.o. female who presented to ER for LOC 05/05/2021. Had LOC for 30 seconds. Given IV dilantin and Keppra. Switched to oral Keppra. Brain MRI negative for acute cause. Pt followed by neurology for headaches, dizziness, photophobia, and sz-like episode. Per Neurology, "mother states she has noticed an increase in word finding difficulty lately."   PAIN:  Are you having pain? No   FALLS: Has patient fallen in last 6 months?  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 Areas of impairment:  Attention: Impaired: Sustained, Alternating, Divided Memory: Impaired: Immediate Short term Executive function: Impaired: Problem solving, Organization, Planning, Error awareness, Self-correction, and Slow processing Behavior: relying on mother for answers Auditory comprehension: Impaired: extra time and repetition for complex directions Verbal expression: Impaired: occasional anomia during conversation; reduced genereative naming Functional communication: Impaired: as above Functional deficits: reported difficulty maintaining conversation with family  AUDITORY COMPREHENSION: Overall auditory comprehension: Impaired: complex YES/NO questions: Appears intact Following directions: Impaired: complex Conversation: Complex Interfering components: attention, anxiety, and hearing -suspect anxiety given pt's behavior Effective technique: extra processing time and slowed speech   READING COMPREHENSION: TBA  EXPRESSION: verbal  VERBAL EXPRESSION: Level of generative/spontaneous verbalization: sentence and conversation Automatic speech: name: intact and social response: intact  Repetition: Appears intact Naming: Confrontation: 76-100% and Divergent: 8 animals, 4 "m" words in 60s Pragmatics: Impaired: topic appropriateness Comments: tangential at times Interfering components: attention  WRITTEN EXPRESSION: Dominant hand: right   Written expression: Not tested  MOTOR SPEECH: Overall motor speech: Appears intact; very minimal imprecision which appears  c/w pt's hx of hearing loss  Interfering components: hearing  loss   ORAL MOTOR EXAMINATION: Facial : WFL   STANDARDIZED ASSESSMENTS:   Cognitive Linguistic Quick Test: AGE - 18 - 69   The Cognitive Linguistic Quick Test (CLQT) was administered to assess the relative status of five cognitive domains: attention, memory, language, executive functioning, and visuospatial skills. Scores from 10 tasks were used to estimate severity ratings (standardized for age groups 18-69 years and 70-89 years) for each domain, a clock drawing task, as well as an overall composite severity rating of cognition.       Task Score Criterion Cut Scores  Personal Facts 8/8 8  Symbol Cancellation 12/12 11  Confrontation Naming 10/10 10  Clock Drawing  10/13 *suspect affected by generational differences 12  Story Retelling 5/10 6  Symbol Trails 10/10 9  Generative Naming 3/9 5  Design Memory TBA/6 5  Mazes  TBA/8 7  Design Generation TBA/13 6    Unable to complete composite severity ratings as full test was not administered this date       PATIENT REPORTED OUTCOME MEASURES (PROM):  The Communication Effectiveness Survey is a patient-reported outcome measure in which the patient rates their own effectiveness in different communication situations. A higher score indicates greater effectiveness.   Pt's self-rating was 13/32.   Having a conversation with a family member or friends at home. 2 Participating in conversation with strangers in a quiet place. 3 Conversing with a familiar person over the telephone. 2 Conversing with a stranger over the telephone. 1- Not at all effective Being part of a conversation in a noisy environment (social gathering). 2 Speaking to a friend when you are emotionally upset or you are angry. 1- Not at all effective Having a conversation while traveling in a car. 2 Having a conversation with someone at a distance (across a room). 1- Not at all effective    PATIENT  EDUCATION: Education details: role of SLP, SLP POC, results thus far Person educated: Patient and Parent Education method: Explanation Education comprehension: verbalized understanding, needs further education  HOME EXERCISE PROGRAM:        TBD in upcoming sessions    GOALS:  Goals reviewed with patient? Yes     SHORT TERM GOALS: Target date: 10 sessions  Pt will participate in further assessment of functional reading/writing and problem solving to determine level of assistance beneficial for iADLs. Baseline: Goal status: INITIAL   2.  Pt will complete remaining portions of CLQT to further assess pt's functoinal cognitive-linguistic ability. Baseline:  Goal status: INITIAL  3.  Patient will demonstrate improved expressive language skills during communication breakdowns by using multimodal means (semantic feature analysis, circumlocution, synonym/antonym use, gestures, writing, drawing) to repair with min cues.  Baseline:  Goal status: INITIAL   LONG TERM GOALS: Target date: 10/11/22  Pt will report improved effectiveness of verbal communication at d/c based on repeat Communication Effectiveness Survey. Baseline: 13/32 Goal status: INITIAL  2.  Patient and/or family will report use of strategies outside of ST to improve communication (use of scripts, pre-planning, circumlocution).  Baseline:  Goal status: INITIAL     ASSESSMENT:  CLINICAL IMPRESSION:  Patient is a 22 y.o. female who was seen today for cognitive-communication evaluation. Evaluation completed via CLQT and PROM. Unable to complete full CLQT due to time constraints as pt arrived late to appointment. Further diagnostic and dynamic assessment to be completed during upcoming session. Of note, pt with hx of ADHD and "math disability" per mother and suspect baseline cognitive deficits affecting performance on  today's assessment. Additionally, pt's participation limited by pt's reliance on mother to answer  questions. Despite encouragement and cueing, pt with tendency to look to mother for input during evaluation. Discussed with pt and mother, pt agreed that mother would wait in lobby during upcoming sessions. Pt presents with cognitive-communication deficits affecting attention, memory, problem solving, and functional receptive and expressive communication. Further assessment warranted to determine severity of current deficits as well as their functional impacts on ADLs, IADLs.    OBJECTIVE IMPAIRMENTS include attention, memory, executive functioning, expressive language, and receptive language. These impairments are limiting patient from ADLs/IADLs and effectively communicating at home and in community. Factors affecting potential to achieve goals and functional outcome are previous level of function. Patient will benefit from skilled SLP services to address above impairments and improve overall function.  REHAB POTENTIAL: Fair -Good  PLAN: SLP FREQUENCY: 1-2x/week  SLP DURATION: 12 weeks  PLANNED INTERVENTIONS: Functional tasks, Multimodal communication approach, SLP instruction and feedback, Compensatory strategies, Patient/family education, and Re-evaluation    Clyde Canterbury, M.S., CCC-SLP Speech-Language Pathologist   [facility] at [facility] [address] [place], [place], [address] Phone: [phone]   Fax:  [phone]

## 2022-07-19 NOTE — Progress Notes (Deleted)
  The Communication Effectiveness Survey is a patient-reported outcome measure in which the patient rates their own effectiveness in different communication situations. A higher score indicates greater effectiveness.   Pt's self-rating was ***/32.   Having a conversation with a family member or friends at home. {effectiveness:29731} Participating in conversation with strangers in a quiet place. {effectiveness:29731} Conversing with a familiar person over the telephone. {effectiveness:29731} Conversing with a stranger over the telephone. {effectiveness:29731} Being part of a conversation in a noisy environment (social gathering). {effectiveness:29731} Speaking to a friend when you are emotionally upset or you are angry. {effectiveness:29731} Having a conversation while traveling in a car. {effectiveness:29731} Having a conversation with someone at a distance (across a room). {effectiveness:29731}

## 2022-07-23 ENCOUNTER — Encounter: Payer: Self-pay | Admitting: Internal Medicine

## 2022-07-29 ENCOUNTER — Ambulatory Visit
Admission: EM | Admit: 2022-07-29 | Discharge: 2022-07-29 | Disposition: A | Discharge status: Home / Self Care | Payer: MEDICAID | Attending: Physician Assistant | Admitting: Physician Assistant

## 2022-07-29 DIAGNOSIS — J069 Acute upper respiratory infection, unspecified: Secondary | ICD-10-CM | POA: Diagnosis present

## 2022-07-29 DIAGNOSIS — J029 Acute pharyngitis, unspecified: Secondary | ICD-10-CM | POA: Diagnosis not present

## 2022-07-29 DIAGNOSIS — R051 Acute cough: Secondary | ICD-10-CM | POA: Insufficient documentation

## 2022-07-29 DIAGNOSIS — Z1152 Encounter for screening for COVID-19: Secondary | ICD-10-CM | POA: Diagnosis not present

## 2022-07-29 LAB — SARS CORONAVIRUS 2 BY RT PCR: SARS Coronavirus 2 by RT PCR: NEGATIVE

## 2022-07-29 LAB — GROUP A STREP BY PCR: Group A Strep by PCR: NOT DETECTED

## 2022-07-29 MED ORDER — PROMETHAZINE-DM 6.25-15 MG/5ML PO SYRP: 5.0000 mL | ORAL_SOLUTION | Freq: Four times a day (QID) | ORAL | 0 refills | Status: DC | PRN

## 2022-07-29 NOTE — ED Triage Notes (Signed)
Sore throat; dry Cough; chest pain x 1 day. No known sick exposure and no one with similar symptoms.   Patient tried inhaler, allergy pills, nasal spray with little relief.
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
## 2022-07-29 NOTE — Discharge Instructions (Addendum)
URI/COLD SYMPTOMS:  Negative strep and COVID. Your exam today is consistent with a viral illness. Antibiotics are not indicated at this time. Use medications as directed, including cough syrup, nasal saline, and decongestants. Your symptoms should improve over the next few days and resolve within 7-10 days. Increase rest and fluids. F/u if symptoms worsen or predominate such as sore throat, ear pain, productive cough, shortness of breath, or if you develop high fevers or worsening fatigue over the next several days.   

## 2022-07-31 ENCOUNTER — Ambulatory Visit: Payer: MEDICAID | Attending: Neurology | Admitting: Speech Pathology

## 2022-07-31 DIAGNOSIS — R41841 Cognitive communication deficit: Secondary | ICD-10-CM | POA: Insufficient documentation

## 2022-07-31 NOTE — Therapy (Addendum)
OUTPATIENT SPEECH LANGUAGE COGNITIVE-COMMUNICATION TREATMENT   Patient Name: Deborah Rhodes MRN: [id number] DOB:September 04, 2000, 22 y.o., female Today's Date: 07/31/2022  PCP: Sampson Si, NP  REFERRING PROVIDER: Malvin Johns, MD   End of Session - 07/31/22 1310     Visit Number 2    Number of Visits 25    Date for SLP Re-Evaluation 10/11/22    SLP Start Time 0931    SLP Stop Time  1035    SLP Time Calculation (min) 64 min    Activity Tolerance Patient tolerated treatment well             Patient Active Problem List   Diagnosis Date Noted   Iron deficiency anemia 09/19/2021   Frequent headaches 02/16/2021   Chronic constipation 02/16/2021   Recurrent major depressive disorder, in partial remission (HCC) 02/16/2021   Class 1 obesity due to excess calories with body mass index (BMI) of 31.0 to 31.9 in adult 02/16/2021   Syncope, vasovagal 08/19/2017   Attention deficit hyperactivity disorder (ADHD), combined type 08/19/2017    ONSET DATE: 06/13/22  REFERRING DIAG: Cognitive-communication deficits  THERAPY DIAG:  Cognitive communication deficit  Rationale for Evaluation and Treatment Rehabilitation  SUBJECTIVE:   SUBJECTIVE STATEMENT: Pt alert and cooperative. "I have a cold."  Pt accompanied by: family member, Mother - waited in lobby  PERTINENT HISTORY: Pt is a 22 y.o. female who presented to ER for LOC 05/05/2021. Had LOC for 30 seconds. Given IV dilantin and Keppra. Switched to oral Keppra. Brain MRI negative for acute cause. Pt followed by neurology for headaches, dizziness, photophobia, and sz-like episode. Per Neurology, "mother states she has noticed an increase in word finding difficulty lately."   PAIN:  Are you having pain? No   FALLS: Has patient fallen in last 6 months?  Yes, fall with LOC event  LIVING ENVIRONMENT: Lives with: lives with their family Lives in: House/apartment  PLOF:  Level of assistance: Comment: indep with ADLs, limited  iADLs Employment: Other: unemployed since March   PATIENT GOALS   to improve communication; to live alone; *mother would like to know if she qualifies for disability  OBJECTIVE:   Today's Treatment: Pt seen for further diagnostic assessment of functional cognitive-linguistic ability including executive visual memory, executive functioning,  confrontation naming, reading, and writing.     Cognitive Linguistic Quick Test: AGE - 18 - 69   The Cognitive Linguistic Quick Test (CLQT) was administered to assess the relative status of five cognitive domains: attention, memory, language, executive functioning, and visuospatial skills. Scores from 10 tasks were used to estimate severity ratings (standardized for age groups 18-69 years and 70-89 years) for each domain, a clock drawing task, as well as an overall composite severity rating of cognition.       Task Score Criterion Cut Scores  Personal Facts 8/8 8  Symbol Cancellation 12/12 11  Confrontation Naming 10/10 10  Clock Drawing  10/13 *suspect affected by generational differences 12  Story Retelling 5/10 6  Symbol Trails 10/10 9  Generative Naming 3/9 5  Design Memory 6/6 5  Mazes  7/8 7  Design Generation 6/13 6   Composite Severity Score: mild Of note, the last 3 subtests were administered this date.     [place] Naming Test Short Form Pt able to independently name 13/15 objects indepedently. 15/15 with written choices from f=4. Mild higher level anomia appreciated.   Functional Reading Subtest of Reading Comprehension Battery for Aphasia Pt scored 6/10. Anticipate pt would need some level  of assistance with functional reading tasks in home setting. Per pt and mother, this is baseline for patient .   Functional Writing  Pt able to fill out basic intake form with extra time.   Functional Expressive Language  During diagnostic tx and lengthy conversation, only x1 instance of anomia noted with independent repair by pt. Of note,  mother present during anomic event. Pt commented that mother makes her "nervous."  Education and supportive counseling Pt endorsed changes to speech since the loss of her brother, Luisa Hart. Pt stated she is feeling "depressed" and prefers to "sleep the day away to avoid it all." This seems to have been going on for about a year (since her brother's death), and it seems to be a precipitating factor in her losing her job.  Per pt and mother, pt unable to get an appointment with a mental health provider. PCP notified via Secure Chat. Discussed the possible effects of mental health/grief on cognitive-linguistic functioning and ways to promote mental physical/cognitive health (e.g. having a routine, physical activity, hobbies, journaling). Additionally, pt and mother note pt has an appointment with a vocational counselor next week with the hope that she will find employment. Supportive counseling provided in regard to current cognitive-linguistic functioning.           PATIENT EDUCATION: Education details: role of SLP, SLP POC, basic strategies to improve cognition, possible changes to cognitive-linguistic functioning related to mental health/grief Person educated: Patient and Parent Education method: Explanation Education comprehension: verbalized understanding, needs further education  HOME EXERCISE PROGRAM:        TBD in upcoming sessions    GOALS:  Goals reviewed with patient? Yes     SHORT TERM GOALS: Target date: 10 sessions  Pt will participate in further assessment of functional reading/writing and problem solving to determine level of assistance beneficial for iADLs. Baseline: Goal status: INITIAL   2.  Pt will complete remaining portions of CLQT to further assess pt's functoinal cognitive-linguistic ability. Baseline:  Goal status: MET  3.  Patient will demonstrate improved expressive language skills during communication breakdowns by using multimodal means (semantic  feature analysis, circumlocution, synonym/antonym use, gestures, writing, drawing) to repair with min cues.  Baseline:  Goal status: INITIAL   LONG TERM GOALS: Target date: 10/11/22  Pt will report improved effectiveness of verbal communication at d/c based on repeat Communication Effectiveness Survey. Baseline: 13/32 Goal status: INITIAL  2.  Patient and/or family will report use of strategies outside of ST to improve communication (use of scripts, pre-planning, circumlocution).  Baseline:  Goal status: INITIAL     ASSESSMENT:  CLINICAL IMPRESSION:  Patient is a 22 y.o. female who was seen today for skilled SLP services targeting cognitive-communication. Pt with mild-moderately impaired cognitive-linguistic ability affecting attention, memory, and expressive communication. Limiting factors include pt's mental health and baseline learning disability. See above for details of tx session. SLP to continue for f/u per established POC.   OBJECTIVE IMPAIRMENTS include attention, memory, executive functioning, expressive language, and receptive language. These impairments are limiting patient from ADLs/IADLs and effectively communicating at home and in community. Factors affecting potential to achieve goals and functional outcome are previous level of function. Patient will benefit from skilled SLP services to address above impairments and improve overall function.  REHAB POTENTIAL: Fair -Good  PLAN: SLP FREQUENCY: 1-2x/week  SLP DURATION: 12 weeks  PLANNED INTERVENTIONS: Functional tasks, Multimodal communication approach, SLP instruction and feedback, Compensatory strategies, Patient/family education, and Re-evaluation    Clyde Canterbury, M.S.,  CCC-SLP Speech-Language Pathologist   [facility] at [facility] [address] [place], [place], [address] Phone: [phone]   Fax:  [phone]

## 2022-08-02 ENCOUNTER — Ambulatory Visit: Payer: MEDICAID | Admitting: Speech Pathology

## 2022-08-02 DIAGNOSIS — R41841 Cognitive communication deficit: Secondary | ICD-10-CM

## 2022-08-02 NOTE — Therapy (Signed)
OUTPATIENT SPEECH LANGUAGE COGNITIVE-COMMUNICATION TREATMENT   Patient Name: Deborah Rhodes MRN: [id number] DOB:Mar 30, 2000, 22 y.o., female Today's Date: 08/02/2022  PCP: Sampson Si, NP  REFERRING PROVIDER: Malvin Johns, MD   End of Session - 08/02/22 1132     Visit Number 3    Number of Visits 25    Date for SLP Re-Evaluation 10/11/22    SLP Start Time 0920    SLP Stop Time  1005    SLP Time Calculation (min) 45 min    Activity Tolerance Patient tolerated treatment well             Patient Active Problem List   Diagnosis Date Noted   Iron deficiency anemia 09/19/2021   Frequent headaches 02/16/2021   Chronic constipation 02/16/2021   Recurrent major depressive disorder, in partial remission (HCC) 02/16/2021   Class 1 obesity due to excess calories with body mass index (BMI) of 31.0 to 31.9 in adult 02/16/2021   Syncope, vasovagal 08/19/2017   Attention deficit hyperactivity disorder (ADHD), combined type 08/19/2017    ONSET DATE: 06/13/22  REFERRING DIAG: Cognitive-communication deficits  THERAPY DIAG:  Cognitive communication deficit  Rationale for Evaluation and Treatment Rehabilitation  SUBJECTIVE:   SUBJECTIVE STATEMENT: Pt alert and cooperative. "I didn't sleep well."  Pt accompanied by: family member, Mother - waited in lobby  PERTINENT HISTORY: Pt is a 22 y.o. female who presented to ER for LOC 05/05/2021. Had LOC for 30 seconds. Given IV dilantin and Keppra. Switched to oral Keppra. Brain MRI negative for acute cause. Pt followed by neurology for headaches, dizziness, photophobia, and sz-like episode. Per Neurology, "mother states she has noticed an increase in word finding difficulty lately."   PAIN:  Are you having pain? No   FALLS: Has patient fallen in last 6 months?  Yes, fall with LOC event  LIVING ENVIRONMENT: Lives with: lives with their family Lives in: House/apartment  PLOF:  Level of assistance: Comment: indep with ADLs, limited  iADLs Employment: Other: unemployed since March   PATIENT GOALS   to improve communication; to live alone; *mother would like to know if she qualifies for disability  OBJECTIVE:   TODAY'S TREATMENT:  Pt and mother provided with typewritten list of local mental health providers. Reviewed possible changes to cognitive-linguistic functioning with changes in mental health/grief. Pt identified x4 activities to promote cognitive-linguistic wellness (e.g. sleeping well, establishing a routine, reading, journaling). Pt endorsed borrowing a book from the [employer] yesterday and stated she started reading it. Introduced compensatory strategies for anomia. Pt required initial mod/max cueing for use of semantic features analysis during structured tasks, improving to min cues while playing "Guess Word" app. Of note, no overt wordfinding difficulty appreciated conversationally this date and pt able to name several low frequency words with use of anomia strategies.   PATIENT EDUCATION: Education details: role of SLP, SLP POC, basic strategies to improve cognition, possible changes to cognitive-linguistic functioning related to mental health/grief, anomia strategies, HEP Person educated: Patient and Parent Education method: Explanation Education comprehension: verbalized understanding, needs further education  HOME EXERCISE PROGRAM:        Set up Guess Word app for HEP targeting use of anomia strategies    GOALS:  Goals reviewed with patient? Yes     SHORT TERM GOALS: Target date: 10 sessions  Pt will participate in further assessment of functional reading/writing and problem solving to determine level of assistance beneficial for iADLs. Baseline: Goal status: IN PROGRESS   2.  Pt will complete remaining  portions of CLQT to further assess pt's functoinal cognitive-linguistic ability. Baseline:  Goal status: MET  3.  Patient will demonstrate improved expressive language skills during  communication breakdowns by using multimodal means (semantic feature analysis, circumlocution, synonym/antonym use, gestures, writing, drawing) to repair with min cues.  Baseline:  Goal status: INITIAL   LONG TERM GOALS: Target date: 10/11/22  Pt will report improved effectiveness of verbal communication at d/c based on repeat Communication Effectiveness Survey. Baseline: 13/32 Goal status: INITIAL  2.  Patient and/or family will report use of strategies outside of ST to improve communication (use of scripts, pre-planning, circumlocution).  Baseline:  Goal status: INITIAL     ASSESSMENT:  CLINICAL IMPRESSION:  Patient is a 22 y.o. female who was seen today for skilled SLP services targeting functional cognitive-communication. Suspect pt's overall mental health affecting cognitive-linguistic ability as pt endorses feeling "depressed" since her brother passed away around 1 year ago. Pt with good participation in today's treatment session and is stimulable for anomia strategies. See details of tx session above. Will continue to f/u per established POC.  OBJECTIVE IMPAIRMENTS include attention, memory, executive functioning, expressive language, and receptive language. These impairments are limiting patient from ADLs/IADLs and effectively communicating at home and in community. Factors affecting potential to achieve goals and functional outcome are previous level of function. Patient will benefit from skilled SLP services to address above impairments and improve overall function.  REHAB POTENTIAL: Fair -Good  PLAN: SLP FREQUENCY: 1-2x/week  SLP DURATION: 12 weeks  PLANNED INTERVENTIONS: Functional tasks, Multimodal communication approach, SLP instruction and feedback, Compensatory strategies, Patient/family education, and Re-evaluation    Clyde Canterbury, M.S., CCC-SLP Speech-Language Pathologist   [facility] at [facility] [address] [place], [place], [address] Phone: [phone]   Fax:  [phone]

## 2022-08-06 ENCOUNTER — Ambulatory Visit: Payer: MEDICAID | Admitting: Speech Pathology

## 2022-08-06 DIAGNOSIS — R41841 Cognitive communication deficit: Secondary | ICD-10-CM

## 2022-08-06 NOTE — Therapy (Signed)
OUTPATIENT SPEECH LANGUAGE COGNITIVE-COMMUNICATION TREATMENT   Patient Name: Deborah Rhodes MRN: [id number] DOB:01/06/2001, 22 y.o., female Today's Date: 08/06/2022  PCP: Sampson Si, NP  REFERRING PROVIDER: Malvin Johns, MD   End of Session - 08/06/22 0935     Visit Number 4    Number of Visits 25    Date for SLP Re-Evaluation 10/11/22    SLP Start Time 0845    SLP Stop Time  0932    SLP Time Calculation (min) 47 min             Patient Active Problem List   Diagnosis Date Noted   Iron deficiency anemia 09/19/2021   Frequent headaches 02/16/2021   Chronic constipation 02/16/2021   Recurrent major depressive disorder, in partial remission (HCC) 02/16/2021   Class 1 obesity due to excess calories with body mass index (BMI) of 31.0 to 31.9 in adult 02/16/2021   Syncope, vasovagal 08/19/2017   Attention deficit hyperactivity disorder (ADHD), combined type 08/19/2017    ONSET DATE: 06/13/22  REFERRING DIAG: Cognitive-communication deficits  THERAPY DIAG:  Cognitive communication deficit  Rationale for Evaluation and Treatment Rehabilitation  SUBJECTIVE:   SUBJECTIVE STATEMENT: Pt alert and cooperative. "I'm doing better" (regarding wordfinding)  Pt accompanied by: family member, Mother - waited in lobby  PERTINENT HISTORY: Pt is a 22 y.o. female who presented to ER for LOC 05/05/2021. Had LOC for 30 seconds. Given IV dilantin and Keppra. Switched to oral Keppra. Brain MRI negative for acute cause. Pt followed by neurology for headaches, dizziness, photophobia, and sz-like episode. Per Neurology, "mother states she has noticed an increase in word finding difficulty lately."   PAIN:  Are you having pain? No   FALLS: Has patient fallen in last 6 months?  Yes, fall with LOC event  LIVING ENVIRONMENT: Lives with: lives with their family Lives in: House/apartment  PLOF:  Level of assistance: Comment: indep with ADLs, limited iADLs Employment: Other: unemployed  since March   PATIENT GOALS   to improve communication; to live alone; *mother would like to know if she qualifies for disability  OBJECTIVE:   TODAY'S TREATMENT:  Reviewed possible changes to cognitive-linguistic functioning with changes in mental health/grief. Pt identified x4 activities to promote cognitive-linguistic wellness (e.g. sleeping well, establishing a routine, reading, journaling). Pt endorsed going to see a movie over the weekend and enjoying it. Reviewed compensatory strategies for anomia. Pt required initial min/mod cueing for use of semantic features analysis during structured tasks, improving to occasional (x3) verbal cues while playing "5 Second Rule" and "Hot Potato." Of note, no overt wordfinding difficulty appreciated conversationally this date and pt able to name several low frequency words with use of anomia strategies. Given pt's subjective improvement in wordfinding, readministered PROM with results as follows.  The Communication Effectiveness Survey is a patient-reported outcome measure in which the patient rates their own effectiveness in different communication situations. A higher score indicates greater effectiveness.   Pt's self-rating was 19/32 which is improved from 13/32 on 07/19/22.  Having a conversation with a family member or friends at home. 3 Participating in conversation with strangers in a quiet place. 2 Conversing with a familiar person over the telephone. 3 Conversing with a stranger over the telephone. 2 Being part of a conversation in a noisy environment (social gathering). 3 Speaking to a friend when you are emotionally upset or you are angry. 1- Not at all effective Having a conversation while traveling in a car. 3 Having a conversation with someone at a  distance (across a room). 2      PATIENT EDUCATION: Education details: role of SLP, SLP POC, basic strategies to improve cognition, possible changes to cognitive-linguistic functioning  related to mental health/grief, anomia strategies, HEP Person educated: Patient and Parent Education method: Explanation Education comprehension: verbalized understanding, needs further education  HOME EXERCISE PROGRAM:        Mother is going to log wordfinding issues to help determine an factors that are affecting p's communication    GOALS:  Goals reviewed with patient? Yes     SHORT TERM GOALS: Target date: 10 sessions  Pt will participate in further assessment of functional reading/writing and problem solving to determine level of assistance beneficial for iADLs. Baseline: Goal status: IN PROGRESS   2.  Pt will complete remaining portions of CLQT to further assess pt's functoinal cognitive-linguistic ability. Baseline:  Goal status: MET  3.  Patient will demonstrate improved expressive language skills during communication breakdowns by using multimodal means (semantic feature analysis, circumlocution, synonym/antonym use, gestures, writing, drawing) to repair with min cues.  Baseline:  Goal status: IN PROGRESS   LONG TERM GOALS: Target date: 10/11/22  Pt will report improved effectiveness of verbal communication at d/c based on repeat Communication Effectiveness Survey. Baseline: 13/32; Readministration on 7/15 19/32 Goal status: MET  2.  Patient and/or family will report use of strategies outside of ST to improve communication (use of scripts, pre-planning, circumlocution).  Baseline:  Goal status: INITIAL     ASSESSMENT:  CLINICAL IMPRESSION:  Patient is a 22 y.o. female who was seen today for skilled SLP services targeting functional cognitive-communication. Suspect pt's overall mental health affecting cognitive-linguistic ability as pt endorses feeling "depressed" since her brother passed away around 1 year ago. Pt with good participation in today's treatment session and is beginning to implement anomia strategies. See details of tx session above. Will continue  to f/u per established POC.  OBJECTIVE IMPAIRMENTS include attention, memory, executive functioning, expressive language, and receptive language. These impairments are limiting patient from ADLs/IADLs and effectively communicating at home and in community. Factors affecting potential to achieve goals and functional outcome are previous level of function. Patient will benefit from skilled SLP services to address above impairments and improve overall function.  REHAB POTENTIAL: Fair -Good  PLAN: SLP FREQUENCY: 1-2x/week  SLP DURATION: 12 weeks  PLANNED INTERVENTIONS: Functional tasks, Multimodal communication approach, SLP instruction and feedback, Compensatory strategies, Patient/family education, and Re-evaluation    Clyde Canterbury, M.S., CCC-SLP Speech-Language Pathologist   [facility] at [facility] [address] [place], [place], [address] Phone: [phone]   Fax:  [phone]

## 2022-08-07 ENCOUNTER — Ambulatory Visit: Payer: MEDICAID | Admitting: Speech Pathology

## 2022-08-13 ENCOUNTER — Ambulatory Visit: Payer: MEDICAID | Admitting: Speech Pathology

## 2022-08-16 ENCOUNTER — Ambulatory Visit: Payer: MEDICAID | Admitting: Speech Pathology

## 2022-08-16 ENCOUNTER — Encounter: Payer: Self-pay | Admitting: Speech Pathology

## 2022-08-16 DIAGNOSIS — R41841 Cognitive communication deficit: Secondary | ICD-10-CM | POA: Diagnosis present

## 2022-08-16 NOTE — Therapy (Signed)
OUTPATIENT SPEECH LANGUAGE COGNITIVE-COMMUNICATION TREATMENT   Patient Name: Deborah Rhodes MRN: [id number] DOB:[birth year], 22 y.o., female Today's Date: 08/16/2022  PCP: Sampson Si, NP  REFERRING PROVIDER: Malvin Johns, MD   End of Session - 08/16/22 1255     Visit Number 5    Number of Visits 25    Date for SLP Re-Evaluation 10/11/22    SLP Start Time 0937    SLP Stop Time  1022    SLP Time Calculation (min) 45 min    Activity Tolerance Patient tolerated treatment well             Patient Active Problem List   Diagnosis Date Noted   Iron deficiency anemia 09/19/2021   Frequent headaches 02/16/2021   Chronic constipation 02/16/2021   Recurrent major depressive disorder, in partial remission (HCC) 02/16/2021   Class 1 obesity due to excess calories with body mass index (BMI) of 31.0 to 31.9 in adult 02/16/2021   Syncope, vasovagal 08/19/2017   Attention deficit hyperactivity disorder (ADHD), combined type 08/19/2017    ONSET DATE: 06/13/22  REFERRING DIAG: Cognitive-communication deficits  THERAPY DIAG:  Cognitive communication deficit  Rationale for Evaluation and Treatment Rehabilitation  SUBJECTIVE:   SUBJECTIVE STATEMENT: Pt alert and cooperative with unfamiliar therapist.  Pt accompanied by: family member, Mother - waited in lobby  PERTINENT HISTORY: Pt is a 22 y.o. female who presented to ER for LOC 05/05/2021. Had LOC for 30 seconds. Given IV dilantin and Keppra. Switched to oral Keppra. Brain MRI negative for acute cause. Pt followed by neurology for headaches, dizziness, photophobia, and sz-like episode. Per Neurology, "mother states she has noticed an increase in word finding difficulty lately."   PAIN:  Are you having pain? No   FALLS: Has patient fallen in last 6 months?  Yes, fall with LOC event  LIVING ENVIRONMENT: Lives with: lives with their family Lives in: House/apartment  PLOF:  Level of assistance: Comment: indep with ADLs, limited  iADLs Employment: Other: unemployed since March   PATIENT GOALS   to improve communication; to live alone; *mother would like to know if she qualifies for disability  OBJECTIVE:   TODAY'S TREATMENT:  Pt seen for skilled ST intervention targeting established goals. Pt reports difficulty "getting the right words out" her whole life - getting worse now. Pt participated in naming tasks involving identifying category member given description and providing category members to concrete categories. Pt discussed recent termination from her job at [employer]. No significant word retrieval deficits observed during conversation during this session. When asked about her mother's log of word finding issues at home, pt reported she did not have the list. Pt reported continued depression and anxiety, as well as constantly being tired.   PATIENT EDUCATION: Education details: role of SLP, SLP POC, basic strategies to improve cognition, possible changes to cognitive-linguistic functioning related to mental health/grief, anomia strategies, HEP Person educated: Patient and Parent Education method: Explanation Education comprehension: verbalized understanding, needs further education  HOME EXERCISE PROGRAM:        Mother is going to log wordfinding issues to help determine an factors that are affecting p's communication    GOALS:  Goals reviewed with patient? Yes     SHORT TERM GOALS: Target date: 10 sessions  Pt will participate in further assessment of functional reading/writing and problem solving to determine level of assistance beneficial for iADLs. Baseline: Goal status: IN PROGRESS   2.  Pt will complete remaining portions of CLQT to further assess pt's functoinal  cognitive-linguistic ability. Baseline:  Goal status: MET  3.  Patient will demonstrate improved expressive language skills during communication breakdowns by using multimodal means (semantic feature analysis, circumlocution,  synonym/antonym use, gestures, writing, drawing) to repair with min cues.  Baseline:  Goal status: IN PROGRESS   LONG TERM GOALS: Target date: 10/11/22  Pt will report improved effectiveness of verbal communication at d/c based on repeat Communication Effectiveness Survey. Baseline: 13/32; Readministration on 7/15 19/32 Goal status: MET  2.  Patient and/or family will report use of strategies outside of ST to improve communication (use of scripts, pre-planning, circumlocution).  Baseline:  Goal status: INITIAL     ASSESSMENT:  CLINICAL IMPRESSION:  Patient is a 22 y.o. female who was seen today for skilled SLP services targeting functional cognitive-communication. Pt with good participation in today's treatment session with unfamiliar therapist. She continues to report implementation of anomia strategies. See details of tx session above. Will continue to f/u per established POC.  OBJECTIVE IMPAIRMENTS include attention, memory, executive functioning, expressive language, and receptive language. These impairments are limiting patient from ADLs/IADLs and effectively communicating at home and in community. Factors affecting potential to achieve goals and functional outcome are previous level of function. Patient will benefit from skilled SLP services to address above impairments and improve overall function.  REHAB POTENTIAL: Fair -Good  PLAN: SLP FREQUENCY: 1-2x/week  SLP DURATION: 12 weeks  PLANNED INTERVENTIONS: Functional tasks, Multimodal communication approach, SLP instruction and feedback, Compensatory strategies, Patient/family education, and Re-evaluation    Zamiyah Resendes B. Murvin Natal [facility], CCC-SLP Speech-Language Pathologist   [facility] Outpatient Rehabilitation at [facility] [address] [place], [place], [address] Phone: [phone]   Fax:  [phone]

## 2022-08-20 ENCOUNTER — Other Ambulatory Visit: Payer: Self-pay | Admitting: Internal Medicine

## 2022-08-20 ENCOUNTER — Ambulatory Visit: Payer: MEDICAID | Admitting: Speech Pathology

## 2022-08-20 DIAGNOSIS — R41841 Cognitive communication deficit: Secondary | ICD-10-CM

## 2022-08-20 DIAGNOSIS — F3341 Major depressive disorder, recurrent, in partial remission: Secondary | ICD-10-CM

## 2022-08-20 DIAGNOSIS — F902 Attention-deficit hyperactivity disorder, combined type: Secondary | ICD-10-CM

## 2022-08-20 NOTE — Therapy (Signed)
OUTPATIENT SPEECH LANGUAGE COGNITIVE-COMMUNICATION TREATMENT   Patient Name: Deborah Rhodes MRN: [id number] DOB:May 22, 2000, 22 y.o., female Today's Date: 08/20/2022  PCP: Sampson Si, NP  REFERRING PROVIDER: Malvin Johns, MD   End of Session - 08/20/22 1036     Visit Number 6    Number of Visits 25    Date for SLP Re-Evaluation 10/11/22    SLP Start Time 0930    SLP Stop Time  1015    SLP Time Calculation (min) 45 min    Activity Tolerance Patient tolerated treatment well             Patient Active Problem List   Diagnosis Date Noted   Iron deficiency anemia 09/19/2021   Frequent headaches 02/16/2021   Chronic constipation 02/16/2021   Recurrent major depressive disorder, in partial remission (HCC) 02/16/2021   Class 1 obesity due to excess calories with body mass index (BMI) of 31.0 to 31.9 in adult 02/16/2021   Syncope, vasovagal 08/19/2017   Attention deficit hyperactivity disorder (ADHD), combined type 08/19/2017    ONSET DATE: 06/13/22  REFERRING DIAG: Cognitive-communication deficits  THERAPY DIAG:  Cognitive communication deficit  Rationale for Evaluation and Treatment Rehabilitation  SUBJECTIVE:   SUBJECTIVE STATEMENT: Pt alert and cooperative. Enjoyed recent beach trip.  Pt accompanied by: family member, Mother - waited in lobby  PERTINENT HISTORY: Pt is a 22 y.o. female who presented to ER for LOC 05/05/2021. Had LOC for 30 seconds. Given IV dilantin and Keppra. Switched to oral Keppra. Brain MRI negative for acute cause. Pt followed by neurology for headaches, dizziness, photophobia, and sz-like episode. Per Neurology, "mother states she has noticed an increase in word finding difficulty lately."   PAIN:  Are you having pain? No   FALLS: Has patient fallen in last 6 months?  Yes, fall with LOC event  LIVING ENVIRONMENT: Lives with: lives with their family Lives in: House/apartment  PLOF:  Level of assistance: Comment: indep with ADLs, limited  iADLs Employment: Other: unemployed since March   PATIENT GOALS   to improve communication; to live alone; *mother would like to know if she qualifies for disability  OBJECTIVE:   TODAY'S TREATMENT:  Pt seen for skilled ST intervention targeting established goals. Pt participated circumlocution task during modified version of "Taboo" game with extra time and occasional cues for use of anomia strategies. Pt discussed recent beach trip in detail. X1 anomic event ("candy corn") with indep repair with use of anomia strategies. No other  word retrieval deficits observed during conversation during this session. When asked about her mother's log of word finding issues at home, pt reported she did not have the list; however, pt was able to identify anomic episode from previous evening and reported eventual use of strategies to repair. Pt reported continued depression and anxiety, as well as constantly being tired.   PATIENT EDUCATION: Education details: role of SLP, SLP POC, basic strategies to improve cognition, possible changes to cognitive-linguistic functioning related to mental health/grief, anomia strategies, HEP Person educated: Patient and Parent Education method: Explanation Education comprehension: verbalized understanding, needs further education  HOME EXERCISE PROGRAM:        None provided this date    GOALS:  Goals reviewed with patient? Yes     SHORT TERM GOALS: Target date: 10 sessions  Pt will participate in further assessment of functional reading/writing and problem solving to determine level of assistance beneficial for iADLs. Baseline: Goal status: IN PROGRESS   2.  Pt will complete remaining portions of  CLQT to further assess pt's functoinal cognitive-linguistic ability. Baseline:  Goal status: MET  3.  Patient will demonstrate improved expressive language skills during communication breakdowns by using multimodal means (semantic feature analysis, circumlocution,  synonym/antonym use, gestures, writing, drawing) to repair with min cues.  Baseline:  Goal status: IN PROGRESS   LONG TERM GOALS: Target date: 10/11/22  Pt will report improved effectiveness of verbal communication at d/c based on repeat Communication Effectiveness Survey. Baseline: 13/32; Readministration on 7/15 19/32 Goal status: MET  2.  Patient and/or family will report use of strategies outside of ST to improve communication (use of scripts, pre-planning, circumlocution).  Baseline:  Goal status: INITIAL     ASSESSMENT:  CLINICAL IMPRESSION:  Patient is a 22 y.o. female who was seen today for skilled SLP services targeting functional cognitive-communication. Pt with good participation in today's treatment session.. She continues to report implementation of anomia strategies. Pt is making good progress toward goals. Suspect pt's self-reported depression/anxiety and baseline cognition is playing a role of pt's cognitive-linguistic functioning. See details of tx session above. Will continue to f/u per established POC.  OBJECTIVE IMPAIRMENTS include attention, memory, executive functioning, expressive language, and receptive language. These impairments are limiting patient from ADLs/IADLs and effectively communicating at home and in community. Factors affecting potential to achieve goals and functional outcome are previous level of function. Patient will benefit from skilled SLP services to address above impairments and improve overall function.  REHAB POTENTIAL: Fair -Good  PLAN: SLP FREQUENCY: 1-2x/week  SLP DURATION: 12 weeks  PLANNED INTERVENTIONS: Functional tasks, Multimodal communication approach, SLP instruction and feedback, Compensatory strategies, Patient/family education, and Re-evaluation    Clyde Canterbury, M.S., CCC-SLP Speech-Language Pathologist [place] - [facility] [phone] Arnette Felts)    [facility] Outpatient  Rehabilitation at [facility] [address] [place], [place], [address] Phone: [phone]   Fax:  [phone]

## 2022-08-22 ENCOUNTER — Ambulatory Visit: Payer: MEDICAID | Admitting: Speech Pathology

## 2022-08-22 DIAGNOSIS — R41841 Cognitive communication deficit: Secondary | ICD-10-CM

## 2022-08-22 NOTE — Therapy (Signed)
OUTPATIENT SPEECH LANGUAGE COGNITIVE-COMMUNICATION TREATMENT / DISCHARGE SUMMARY   Patient Name: Deborah Rhodes MRN: [id number] DOB:06/11/2000, 22 y.o., female Today's Date: 08/22/2022  PCP: Sampson Si, NP  REFERRING PROVIDER: Malvin Johns, MD   End of Session - 08/22/22 0933     Visit Number 6    Number of Visits 25    Date for SLP Re-Evaluation 10/11/22    SLP Start Time 0930    SLP Stop Time  1015    SLP Time Calculation (min) 45 min    Activity Tolerance Patient tolerated treatment well             Patient Active Problem List   Diagnosis Date Noted   Iron deficiency anemia 09/19/2021   Frequent headaches 02/16/2021   Chronic constipation 02/16/2021   Recurrent major depressive disorder, in partial remission (HCC) 02/16/2021   Class 1 obesity due to excess calories with body mass index (BMI) of 31.0 to 31.9 in adult 02/16/2021   Syncope, vasovagal 08/19/2017   Attention deficit hyperactivity disorder (ADHD), combined type 08/19/2017    ONSET DATE: 06/13/22  REFERRING DIAG: Cognitive-communication deficits  THERAPY DIAG:  Cognitive communication deficit  Rationale for Evaluation and Treatment Rehabilitation  SUBJECTIVE:   SUBJECTIVE STATEMENT: Pt alert and cooperative. Pt reported that she is doing "great" with her wordfinding difficulty.  Pt accompanied by: family member, Mother - waited in lobby  PERTINENT HISTORY: Pt is a 22 y.o. female who presented to ER for LOC 05/05/2021. Had LOC for 30 seconds. Given IV dilantin and Keppra. Switched to oral Keppra. Brain MRI negative for acute cause. Pt followed by neurology for headaches, dizziness, photophobia, and sz-like episode. Per Neurology, "mother states she has noticed an increase in word finding difficulty lately."   PAIN:  Are you having pain? No   FALLS: Has patient fallen in last 6 months?  Yes, fall with LOC event  LIVING ENVIRONMENT: Lives with: lives with their family Lives in:  House/apartment  PLOF:  Level of assistance: Comment: indep with ADLs, limited iADLs Employment: Other: unemployed since March   PATIENT GOALS   to improve communication; to live alone; *mother would like to know if she qualifies for disability  OBJECTIVE:   TODAY'S TREATMENT:  Pt seen for skilled ST intervention targeting established goals. Higher level wordfinding and reasoning targeting during "Would You Rather.Marland KitchenMarland Kitchen?" Game. Pt's speech was fluent, appropriate, and without s/sx anomia. Pt with intact verbal reasoning skills to debate hypothetical situations. Pt reported continued depression and anxiety, as well as constantly being tired. Reviewed effects of mental health/grief on cognitive-linguistic functioning with pt. Pt able to ID x3 activities to promote cognitive-linguistic wellbeing and overall mental health. Pt and mother educated re: POC, progress to date, and d/c recommendations.   PATIENT EDUCATION: Education details: role of SLP, SLP POC, basic strategies to improve cognition, possible changes to cognitive-linguistic functioning related to mental health/grief, anomia strategies Person educated: Patient and Parent Education method: Explanation Education comprehension: verbalized understanding  HOME EXERCISE PROGRAM:        None provided this date    GOALS:  Goals reviewed with patient? Yes     SHORT TERM GOALS: Target date: 10 sessions  Pt will participate in further assessment of functional reading/writing and problem solving to determine level of assistance beneficial for iADLs. Baseline: Goal status: MET   2.  Pt will complete remaining portions of CLQT to further assess pt's functoinal cognitive-linguistic ability. Baseline:  Goal status: MET  3.  Patient will demonstrate improved expressive  language skills during communication breakdowns by using multimodal means (semantic feature analysis, circumlocution, synonym/antonym use, gestures, writing, drawing) to  repair with min cues.  Baseline:  Goal status: MET   LONG TERM GOALS: Target date: 10/11/22  Pt will report improved effectiveness of verbal communication at d/c based on repeat Communication Effectiveness Survey. Baseline: 13/32; Readministration on 7/15 19/32 Goal status: MET  2.  Patient and/or family will report use of strategies outside of ST to improve communication (use of scripts, pre-planning, circumlocution).  Baseline:  Goal status: MET     ASSESSMENT:  CLINICAL IMPRESSION:  Patient is a 22 y.o. female who was seen today for skilled SLP services targeting functional cognitive-communication. Pt with good participation in today's treatment session.. She continues to report implementation of anomia strategies. Pt has met all goals.  Suspect pt's self-reported depression/anxiety and baseline cognition is playing a role of pt's cognitive-linguistic functioning. See details of tx session above. Pt would benefit from higher level assessment of neurocognitive function in setting of anxiety/depression. Reached out to Nicki Reaper, NP, on 7/29, and she has placed a consult for Neuropsychology.   OBJECTIVE IMPAIRMENTS include attention, memory, executive functioning, expressive language, and receptive language. These impairments are limiting patient from ADLs/IADLs and effectively communicating at home and in community. Factors affecting potential to achieve goals and functional outcome are previous level of function. Patient will benefit from skilled SLP services to address above impairments and improve overall function.  REHAB POTENTIAL: Fair -Good  PLAN: SLP FREQUENCY:  n/a  SLP DURATION: n/a  PLANNED INTERVENTIONS: Functional tasks, Multimodal communication approach, SLP instruction and feedback, Compensatory strategies, Patient/family education, and Re-evaluation    Clyde Canterbury, M.S., CCC-SLP Speech-Language Pathologist [place] - [facility] [phone] Arnette Felts)    [facility] Outpatient Rehabilitation at [facility] [address] [place], [place], [address] Phone: [phone]   Fax:  [phone]

## 2022-08-24 ENCOUNTER — Encounter: Payer: Self-pay | Admitting: Internal Medicine

## 2022-08-27 ENCOUNTER — Ambulatory Visit: Payer: MEDICAID | Admitting: Speech Pathology

## 2022-08-30 ENCOUNTER — Ambulatory Visit: Payer: MEDICAID | Admitting: Speech Pathology

## 2022-09-03 ENCOUNTER — Encounter: Payer: MEDICAID | Admitting: Speech Pathology

## 2022-09-05 ENCOUNTER — Telehealth: Payer: Self-pay

## 2022-09-05 NOTE — Telephone Encounter (Signed)
Received a medical records request for patient requesting all request to be faxed to the Division of Employment of [place] [employer]abilities. Printed records from our office  and faxed to [phone]. Got confirmation fax went through

## 2022-09-06 ENCOUNTER — Encounter: Payer: MEDICAID | Admitting: Speech Pathology

## 2022-09-10 ENCOUNTER — Encounter: Payer: MEDICAID | Admitting: Speech Pathology

## 2022-09-13 ENCOUNTER — Encounter: Payer: MEDICAID | Admitting: Speech Pathology

## 2022-09-19 ENCOUNTER — Encounter: Payer: MEDICAID | Admitting: Speech Pathology

## 2022-09-20 ENCOUNTER — Inpatient Hospital Stay: Payer: MEDICAID | Attending: Internal Medicine

## 2022-09-20 ENCOUNTER — Inpatient Hospital Stay: Payer: MEDICAID | Admitting: Internal Medicine

## 2022-09-20 VITALS — BP 113/78 | HR 84 | Temp 97.5°F | Wt 228.0 lb

## 2022-09-20 DIAGNOSIS — Z79899 Other long term (current) drug therapy: Secondary | ICD-10-CM | POA: Diagnosis not present

## 2022-09-20 DIAGNOSIS — R519 Headache, unspecified: Secondary | ICD-10-CM | POA: Insufficient documentation

## 2022-09-20 DIAGNOSIS — R42 Dizziness and giddiness: Secondary | ICD-10-CM | POA: Diagnosis not present

## 2022-09-20 DIAGNOSIS — F32A Depression, unspecified: Secondary | ICD-10-CM | POA: Diagnosis not present

## 2022-09-20 DIAGNOSIS — K5900 Constipation, unspecified: Secondary | ICD-10-CM | POA: Insufficient documentation

## 2022-09-20 DIAGNOSIS — D508 Other iron deficiency anemias: Secondary | ICD-10-CM | POA: Diagnosis not present

## 2022-09-20 DIAGNOSIS — D509 Iron deficiency anemia, unspecified: Secondary | ICD-10-CM | POA: Diagnosis present

## 2022-09-20 LAB — CBC WITH DIFFERENTIAL/PLATELET
Abs Immature Granulocytes: 0.02 10*3/uL (ref 0.00–0.07)
Basophils Absolute: 0 10*3/uL (ref 0.0–0.1)
Basophils Relative: 1 %
Eosinophils Absolute: 0.1 10*3/uL (ref 0.0–0.5)
Eosinophils Relative: 2 %
HCT: 38.1 % (ref 36.0–46.0)
Hemoglobin: 12.6 g/dL (ref 12.0–15.0)
Immature Granulocytes: 1 %
Lymphocytes Relative: 29 %
Lymphs Abs: 1.3 10*3/uL (ref 0.7–4.0)
MCH: 32 pg (ref 26.0–34.0)
MCHC: 33.1 g/dL (ref 30.0–36.0)
MCV: 96.7 fL (ref 80.0–100.0)
Monocytes Absolute: 0.3 10*3/uL (ref 0.1–1.0)
Monocytes Relative: 6 %
Neutro Abs: 2.6 10*3/uL (ref 1.7–7.7)
Neutrophils Relative %: 61 %
Platelets: 273 10*3/uL (ref 150–400)
RBC: 3.94 MIL/uL (ref 3.87–5.11)
RDW: 11.8 % (ref 11.5–15.5)
WBC: 4.3 10*3/uL (ref 4.0–10.5)
nRBC: 0 % (ref 0.0–0.2)

## 2022-09-20 LAB — FERRITIN: Ferritin: 11 ng/mL (ref 11–307)

## 2022-09-20 LAB — IRON AND TIBC
Iron: 140 ug/dL (ref 28–170)
Saturation Ratios: 40 % — ABNORMAL HIGH (ref 10.4–31.8)
TIBC: 349 ug/dL (ref 250–450)
UIBC: 209 ug/dL
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
## 2022-09-20 NOTE — Progress Notes (Signed)
Patient says that she is doing well. A couple of months ago she started having trouble falling asleep.

## 2022-11-08 ENCOUNTER — Encounter: Payer: Self-pay | Admitting: Internal Medicine

## 2022-11-08 ENCOUNTER — Ambulatory Visit: Payer: MEDICAID | Admitting: Internal Medicine

## 2022-11-08 VITALS — BP 110/80 | HR 88 | Ht 71.5 in | Wt 234.0 lb

## 2022-11-08 DIAGNOSIS — R42 Dizziness and giddiness: Secondary | ICD-10-CM

## 2022-11-08 DIAGNOSIS — Z136 Encounter for screening for cardiovascular disorders: Secondary | ICD-10-CM | POA: Diagnosis not present

## 2022-11-08 DIAGNOSIS — Z6832 Body mass index (BMI) 32.0-32.9, adult: Secondary | ICD-10-CM

## 2022-11-08 DIAGNOSIS — E66811 Obesity, class 1: Secondary | ICD-10-CM

## 2022-11-08 DIAGNOSIS — E6609 Other obesity due to excess calories: Secondary | ICD-10-CM

## 2022-11-08 DIAGNOSIS — R739 Hyperglycemia, unspecified: Secondary | ICD-10-CM

## 2022-11-08 DIAGNOSIS — Z0001 Encounter for general adult medical examination with abnormal findings: Secondary | ICD-10-CM | POA: Diagnosis not present
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
## 2022-11-08 NOTE — Patient Instructions (Signed)

## 2022-11-08 NOTE — Assessment & Plan Note (Signed)
Encouraged diet and exercise for weight loss ?

## 2022-11-12 ENCOUNTER — Other Ambulatory Visit: Payer: Self-pay | Admitting: Internal Medicine

## 2023-01-24 ENCOUNTER — Other Ambulatory Visit: Payer: Self-pay | Admitting: Gastroenterology

## 2023-03-18 IMAGING — MR MR HEAD WO/W CM
13 of 15 series · 38 of 48 positions shown · IV contrast (gadavist)
Comparison: CT head without contrast 05/05/2021. MR head without
and with contrast 10/31/2020

CLINICAL DATA: Seizure disorder. Syncopal episodes.

EXAM:
MRI HEAD WITHOUT AND WITH CONTRAST
TECHNIQUE: Multiplanar, multiecho pulse sequences of the brain and surrounding
structures were obtained without and with intravenous contrast.
CONTRAST:  9mL GADAVIST GADOBUTROL 1 MMOL/ML IV SOLN

[Series 9: T1 · sagittal · 5.0mm · 0.62mm/px · 1 of 23 slices shown (1 of 2)]
[im 1/23]
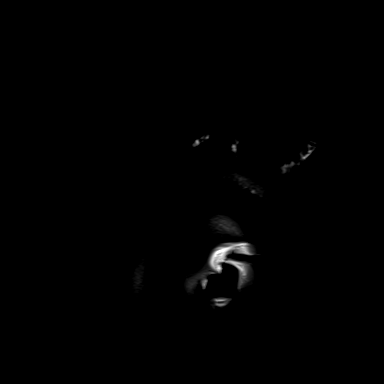

[Series 10: ax dwi_tracew · axial · 3.0mm · 0.71mm/px · z∈[-82,+80]mm · 2 of 56 slices shown]
[im 1/56]
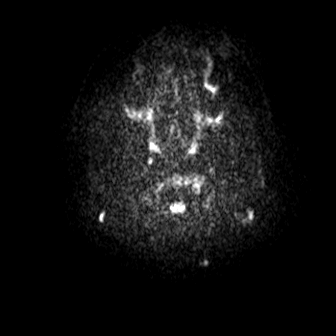
[im 56/56]
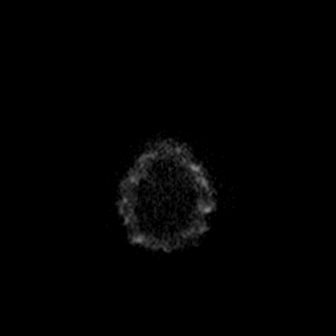

[Series 11: ax dwi_adc · axial · 3.0mm · 0.71mm/px · z∈[-82,+80]mm · 3 of 56 slices shown]
[im 1/56]
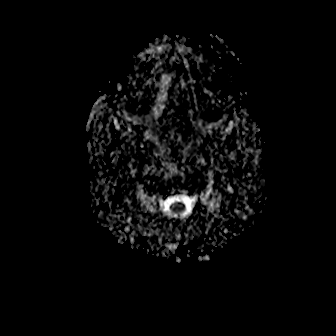
[im 28/56]
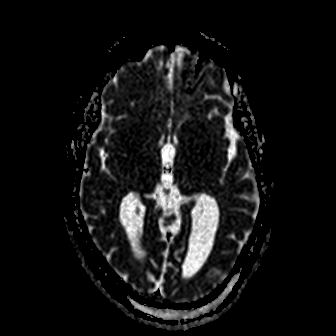
[im 56/56]
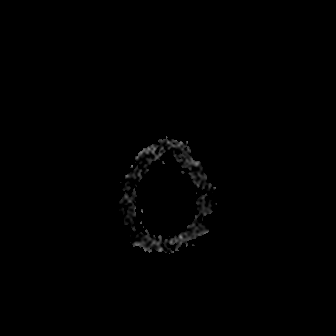

[Series 12: cor dwi_tracew · coronal · 5.0mm · 0.68mm/px · 2 of 40 slices shown]
[im 1/40]
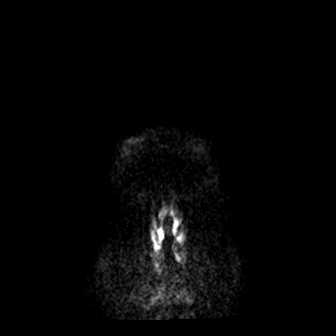
[im 40/40]
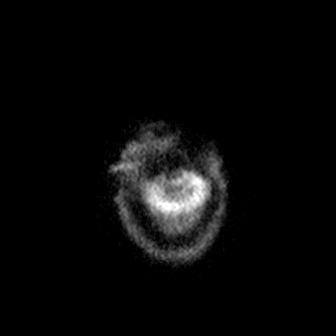

[Series 13: cor dwi_adc · coronal · 5.0mm · 0.68mm/px · 2 of 40 slices shown]
[im 1/40]
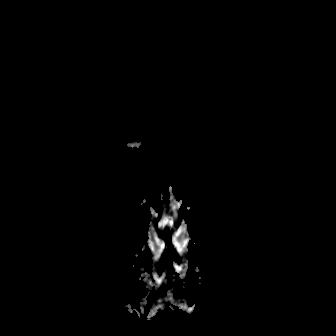
[im 40/40]
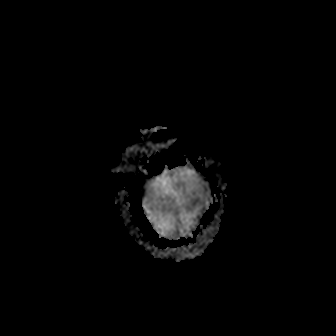

[Series 14: T2 · axial · 5.0mm · 0.53mm/px · 1 of 27 slices shown (1 of 2)]
[im 1/27]
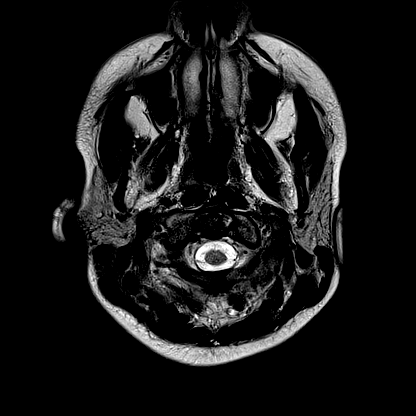

[Series 16: ax swi_pha · axial · 2.0mm · 0.90mm/px · z∈[-70,+33]mm · 3 of 80 slices shown]
[im 1/80]
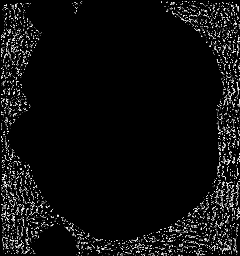
[im 27/80]
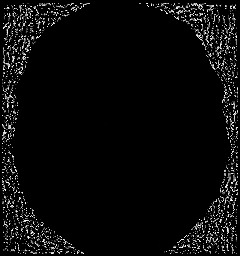
[im 53/80]
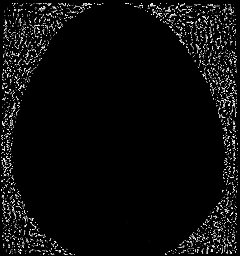

[Series 19: FLAIR · axial · 3.0mm · 0.53mm/px · z∈[-71,+89]mm · 3 of 55 slices shown (1 of 2)]
[im 1/55]
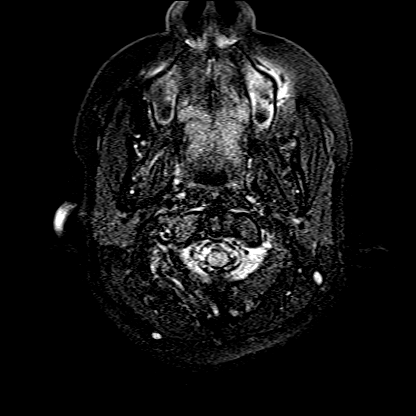
[im 28/55]
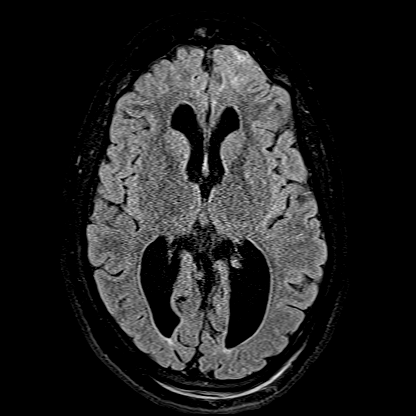
[im 55/55]
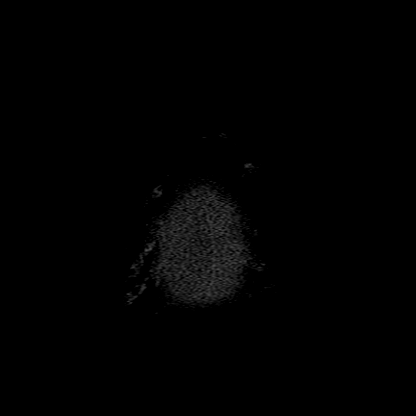

[Series 20: T1 · axial · 1.0mm · 0.98mm/px · z∈[-78,+95]mm · 8 of 176 slices shown (2 of 2)]
[im 1/176]
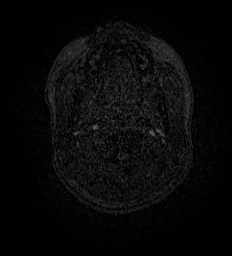
[im 26/176]
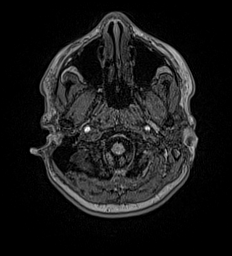
[im 51/176]
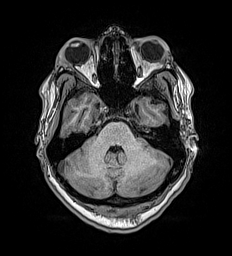
[im 76/176]
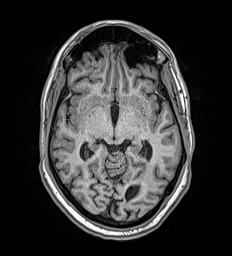
[im 101/176]
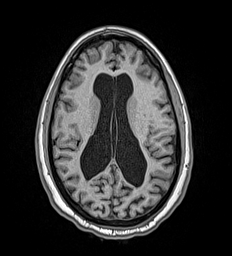
[im 126/176]
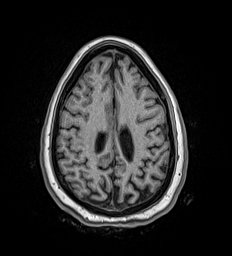
[im 151/176]
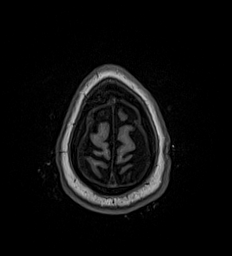
[im 176/176]
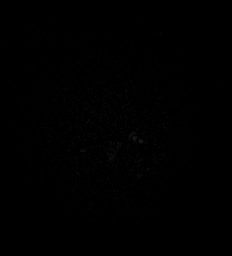

[Series 21: T2 · coronal · 3.0mm · 0.23mm/px · 2 of 35 slices shown (2 of 2)]
[im 1/35]
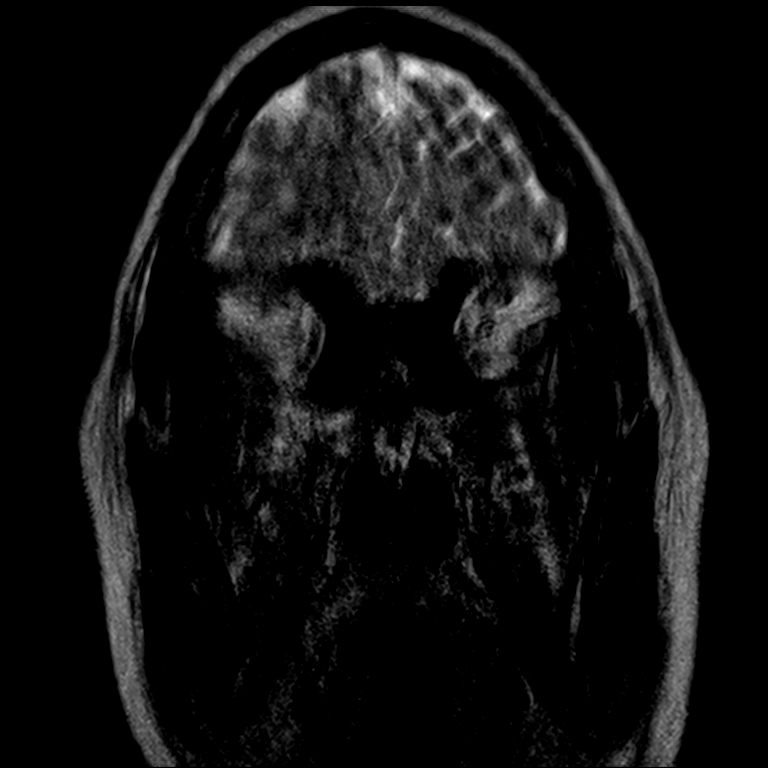
[im 35/35]
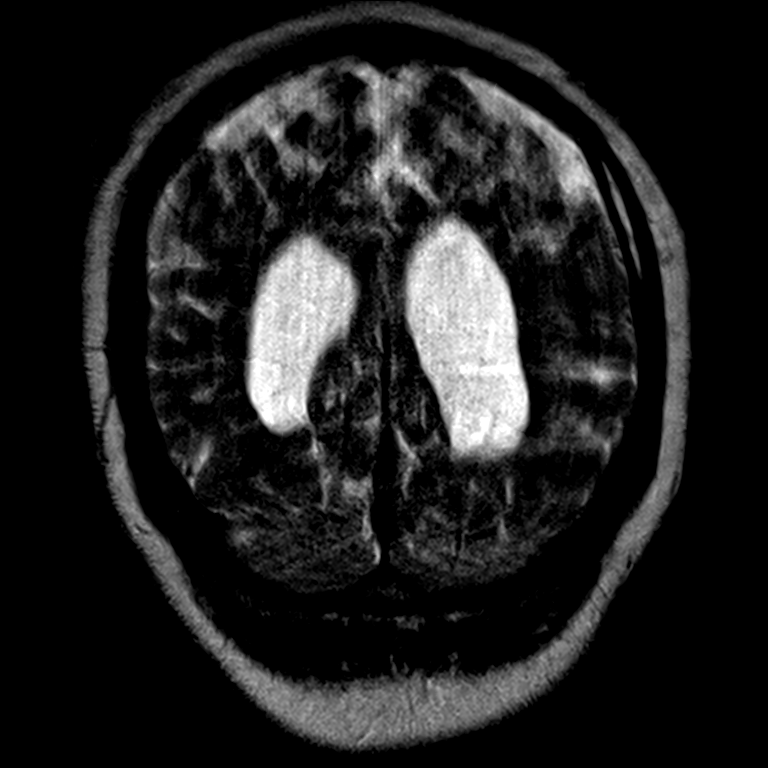

[Series 22: FLAIR · coronal · 3.0mm · 0.35mm/px · 2 of 35 slices shown (2 of 2)]
[im 1/35]
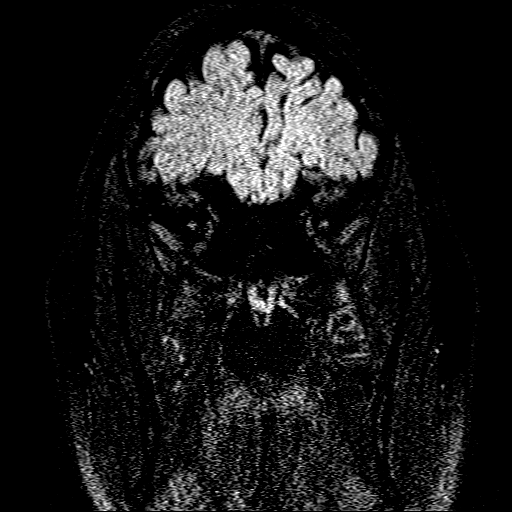
[im 35/35]
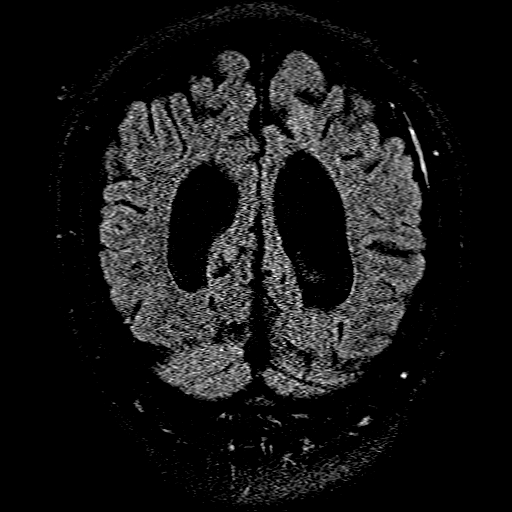

[Series 23: T1 post-contrast · axial · 1.0mm · 0.98mm/px · z∈[-78,+95]mm · 8 of 176 slices shown (1 of 2)]
[im 1/176]
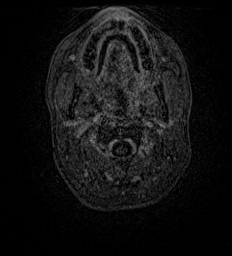
[im 26/176]
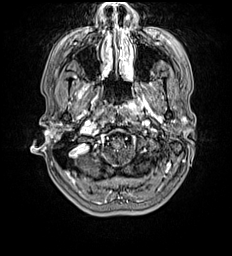
[im 51/176]
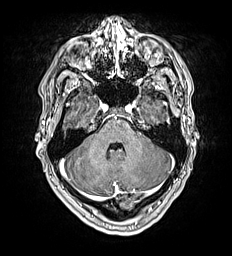
[im 76/176]
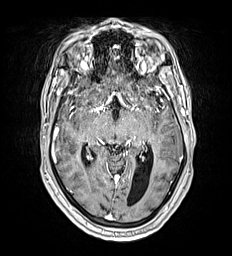
[im 101/176]
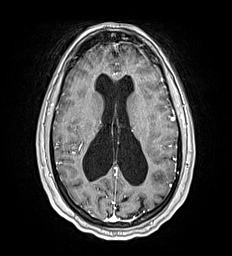
[im 126/176]
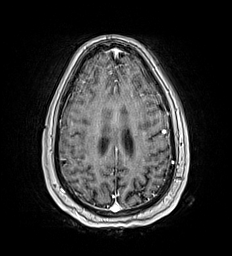
[im 151/176]
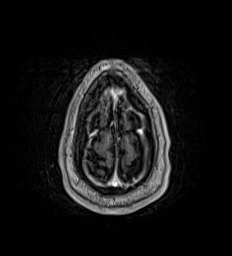
[im 176/176]
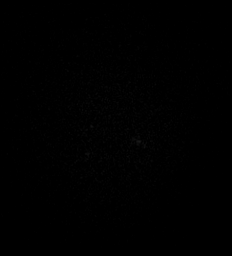

[Series 24: T1 post-contrast · coronal · 5.0mm · 0.57mm/px · 1 of 30 slices shown (2 of 2)]
[im 1/30]
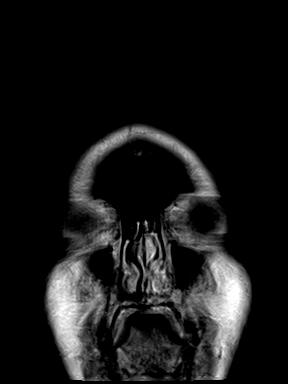

[38 of 48 positions shown; findings below may reference images not displayed]

FINDINGS: Brain: No acute infarct, hemorrhage, or mass lesion is present.
Thinning of the white matter, particularly in the parietal lobes
again noted bilaterally. No significant white matter hyperintensity.
Ex vacuo dilation of the lateral ventricles noted both anteriorly
and posteriorly. Thinning of the corpus callosum noted with marked
atrophy or agenesis posteriorly.

The internal auditory canals are within normal limits. The brainstem
and cerebellum are within normal limits.

Postcontrast images demonstrate no pathologic enhancement.

Vascular: Flow is present in the major intracranial arteries.

Skull and upper cervical spine: The craniocervical junction is
normal. Upper cervical spine is within normal limits. Marrow signal
is unremarkable.

Sinuses/Orbits: The paranasal sinuses and mastoid air cells are
clear. The globes and orbits are within normal limits.
IMPRESSION: 1. No acute intracranial abnormality or significant interval change.
2. Thinning of the white matter, particularly in the parietal lobes
bilaterally.
3. Marked atrophy or agenesis of the posterior corpus callosum.
4. Findings are stable in likely represent the sequelae of prenatal
vascular insult.

## 2023-03-18 IMAGING — CT CT CERVICAL SPINE W/O CM
3 of 5 series · 10 of 33 positions shown, 12 images · non-contrast
Comparison: Brain MRI 10/31/2020.

CLINICAL DATA: Headache, new or worsening, posttraumatic. Poly
trauma, blunt. Additional history provided: Witnessed fall.



[Series 11: sagittal bone · sagittal · 0.19mm/px · 5 of 71 slices shown, 6 images]
[im 24/71  bone]
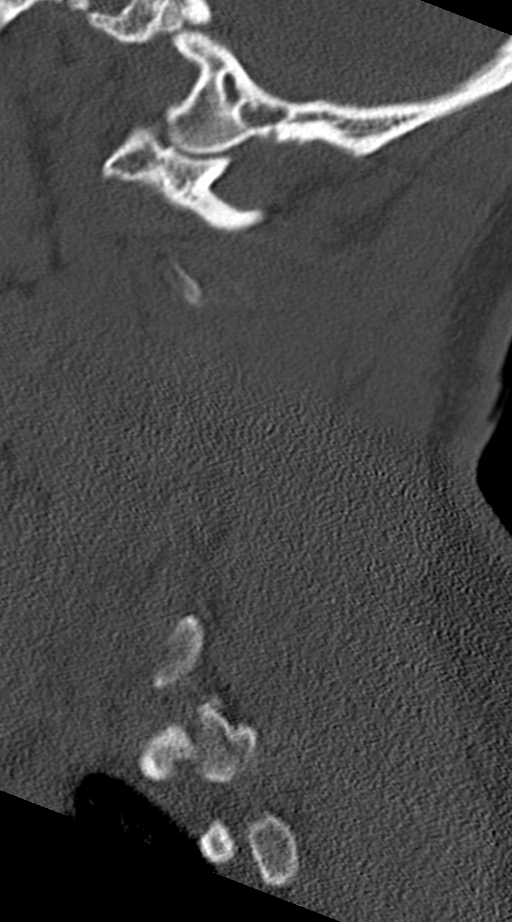
[im 30/71  bone]
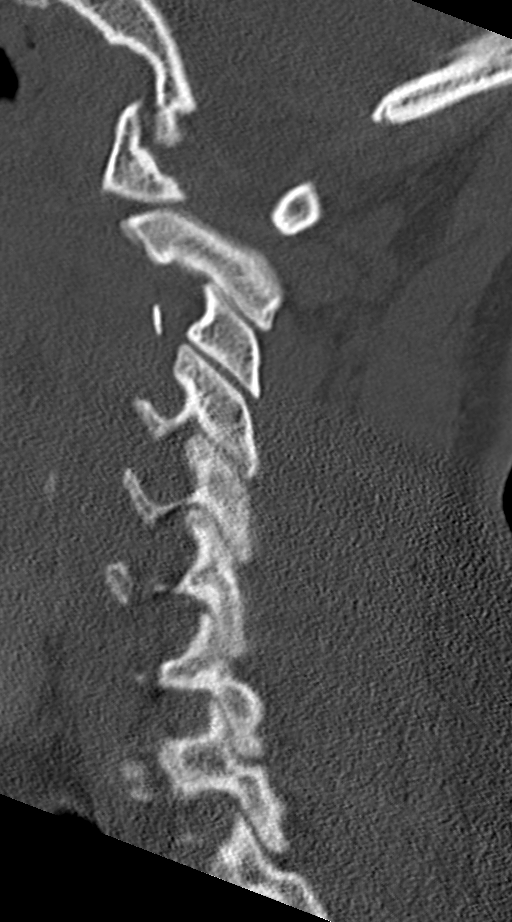
[im 36/71  soft-tissue]
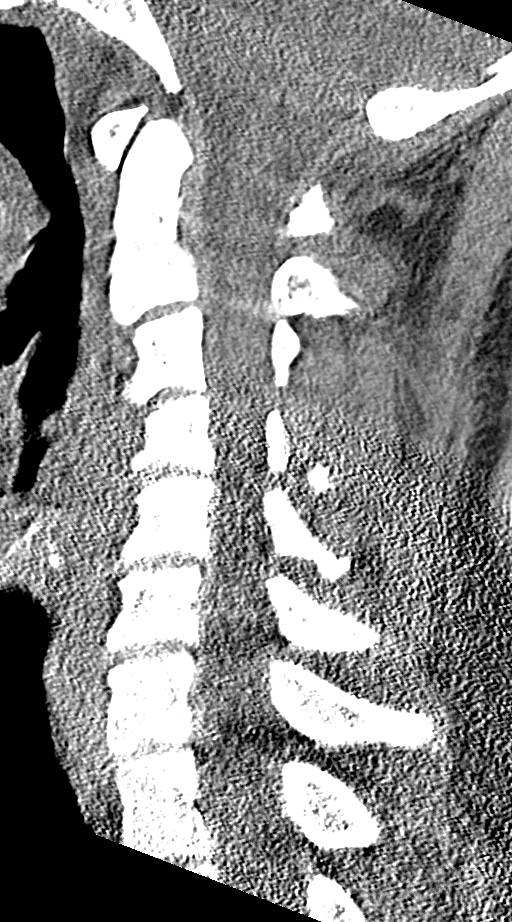
[im 36/71  bone]
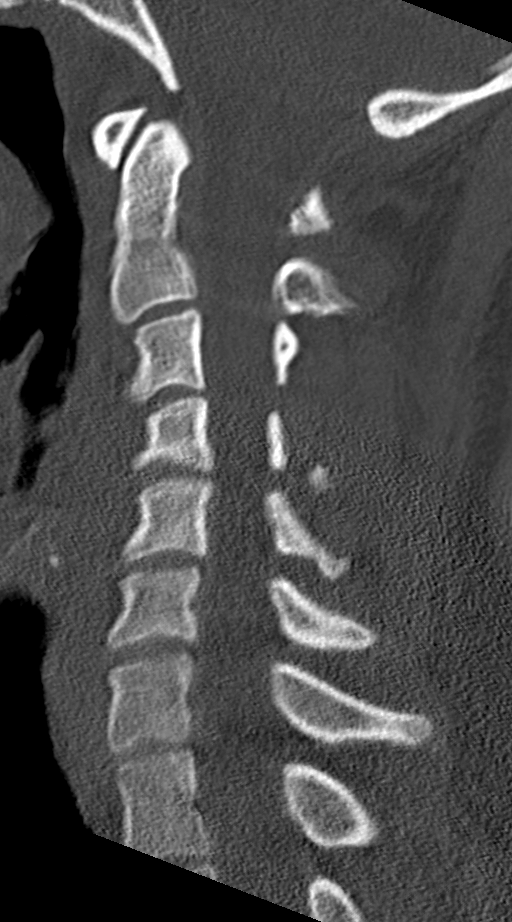
[im 41/71  bone]
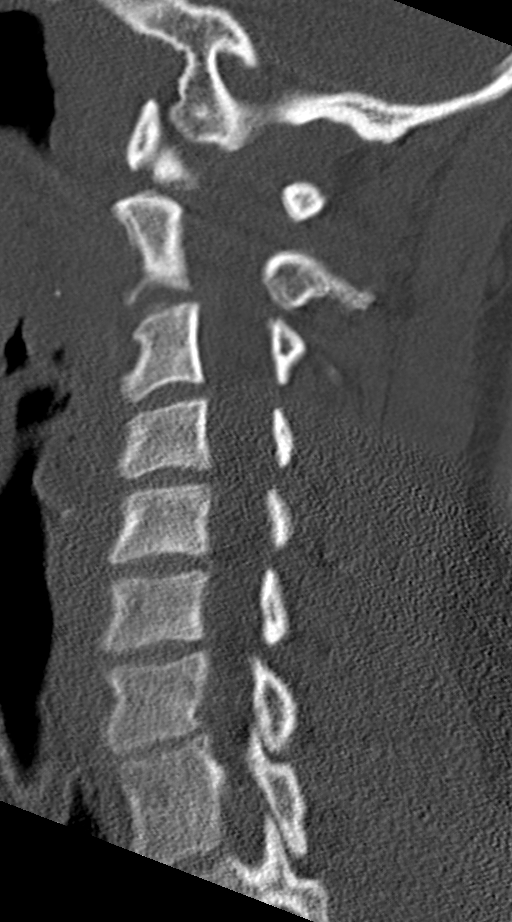
[im 47/71  bone]
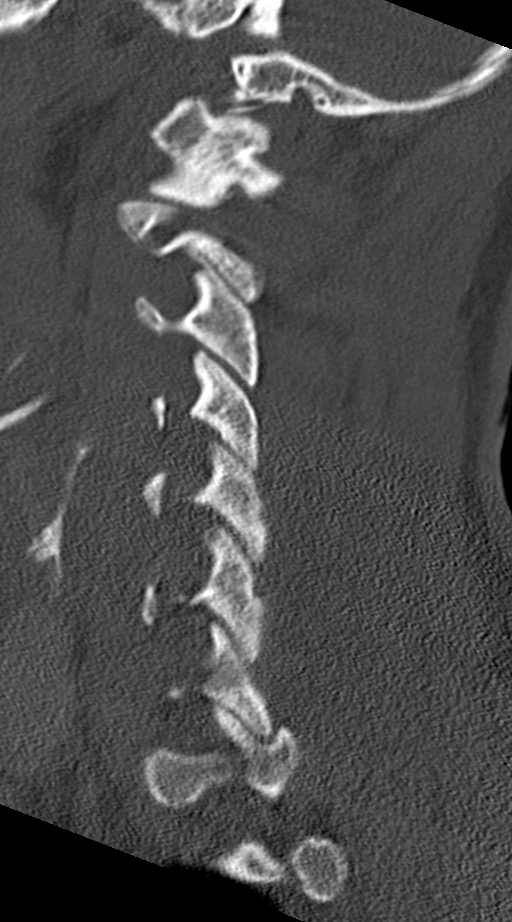

[Series 12: coronal bone · coronal · 0.28mm/px · 3 of 48 slices shown]
[im 10/48  bone]
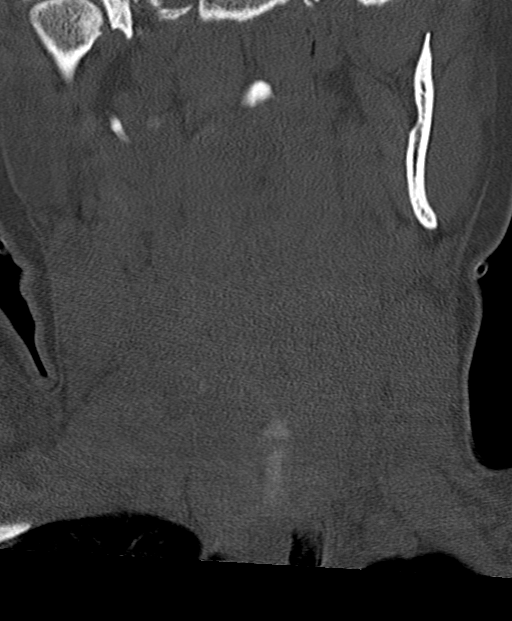
[im 19/48  bone]
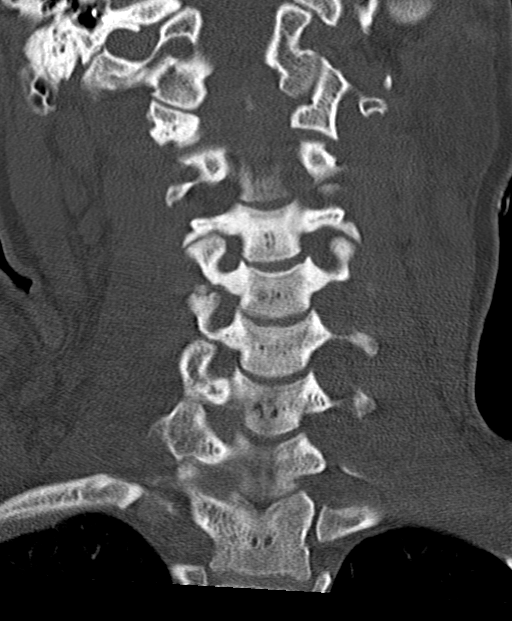
[im 29/48  bone]
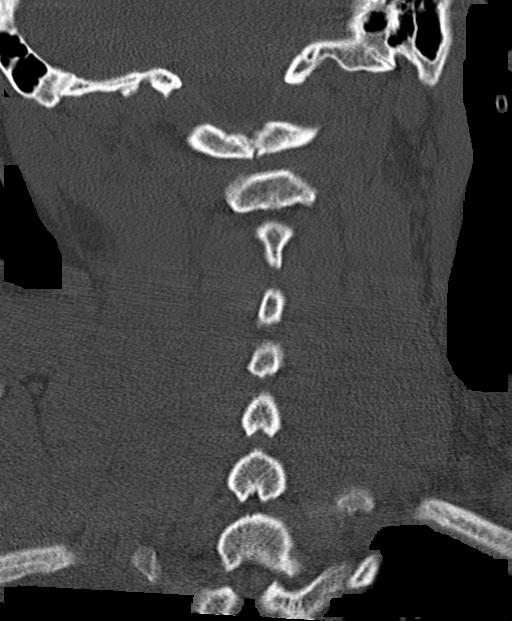

[Series 13: orthogonal bone · axial · 0.19mm/px · z∈[+64,+142]mm · 2 of 86 slices shown, 3 images]
[im 22/86  soft-tissue]
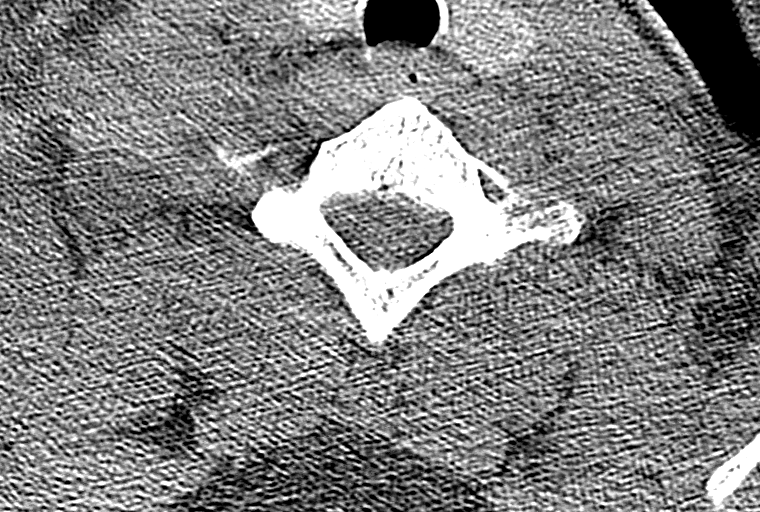
[im 22/86  bone]
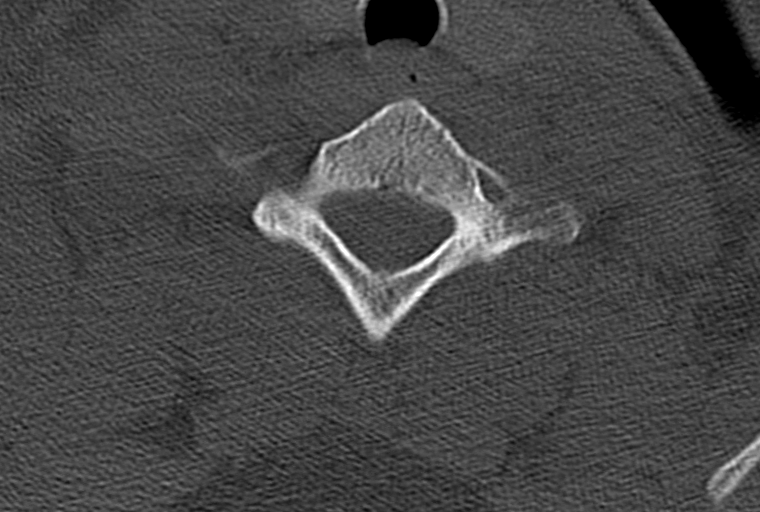
[im 64/86  bone]
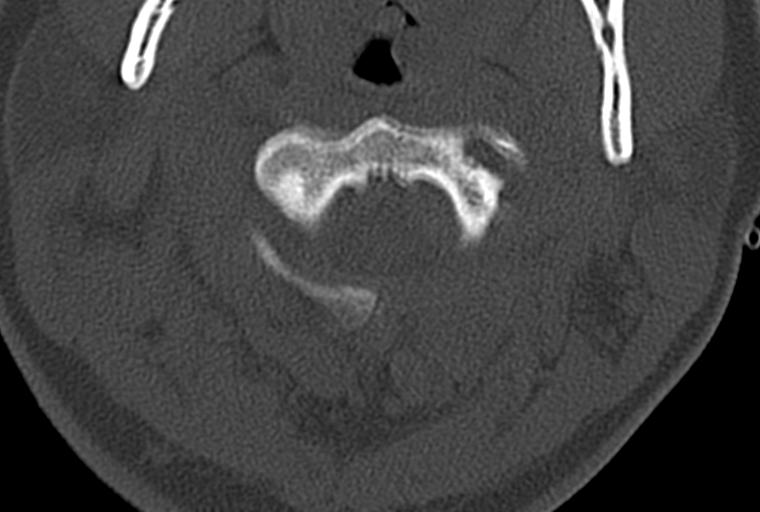

[10 of 33 positions shown; findings below may reference images not displayed]

FINDINGS: CT HEAD FINDINGS

Brain:

Similar to the prior brain MRI of 10/31/2020, there is moderate
ventriculomegaly with central predominant volume loss and an
abnormal morphology of the lateral ventricles. Also unchanged, there
is marked thinning of the posterior body and splenium of the corpus
callosum.

Cavum septum pellucidum and cavum vergae.

There is no acute intracranial hemorrhage.

No demarcated cortical infarct.

No extra-axial fluid collection.

No evidence of an intracranial mass.

No midline shift.

Vascular: No hyperdense vessel.

Skull: Normal. Negative for fracture or focal lesion.

Sinuses/Orbits: Visualized orbits show no acute finding. No
significant paranasal sinus disease at the imaged levels.

CT CERVICAL SPINE FINDINGS

Mildly motion degraded exam.

Alignment: Cervical levocurvature. Reversal of the expected cervical
lordosis. No significant spondylolisthesis.

Skull base and vertebrae: The basion-dental and atlanto-dental
intervals are maintained.No evidence of acute fracture to the
cervical spine. Congenital nonunion of the posterior arch of C1.

Soft tissues and spinal canal: No prevertebral fluid or swelling. No
visible canal hematoma.

Disc levels: No significant bony spinal canal or neural foraminal
narrowing at any level.

Upper chest: No consolidation within the imaged lung apices. No
visible pneumothorax.
IMPRESSION: CT head:

1. No evidence of acute intracranial abnormality.
2. Similar to the prior brain MRI of 10/31/2020, there is moderate
ventriculomegaly with central predominant volume loss and an
abnormal morphology of the lateral ventricles. Also unchanged, there
is marked thinning of the posterior body and splenium of the corpus
callosum. These findings may reflect periventricular leukomalacia.

CT cervical spine:

1. No evidence of acute fracture to the cervical spine.
2. Nonspecific reversal of the expected cervical lordosis.
3. Cervical levocurvature.

## 2023-05-13 ENCOUNTER — Encounter: Payer: Self-pay | Admitting: Internal Medicine

## 2023-05-13 ENCOUNTER — Other Ambulatory Visit: Payer: Self-pay | Admitting: Gastroenterology

## 2023-05-13 ENCOUNTER — Ambulatory Visit (INDEPENDENT_AMBULATORY_CARE_PROVIDER_SITE_OTHER): Payer: MEDICAID | Admitting: Internal Medicine

## 2023-05-13 VITALS — BP 118/78 | Ht 71.5 in | Wt 229.0 lb

## 2023-05-13 DIAGNOSIS — E6609 Other obesity due to excess calories: Secondary | ICD-10-CM

## 2023-05-13 DIAGNOSIS — R55 Syncope and collapse: Secondary | ICD-10-CM

## 2023-05-13 DIAGNOSIS — E66811 Obesity, class 1: Secondary | ICD-10-CM

## 2023-05-13 DIAGNOSIS — F902 Attention-deficit hyperactivity disorder, combined type: Secondary | ICD-10-CM

## 2023-05-13 DIAGNOSIS — K5909 Other constipation: Secondary | ICD-10-CM

## 2023-05-13 DIAGNOSIS — Z136 Encounter for screening for cardiovascular disorders: Secondary | ICD-10-CM | POA: Diagnosis not present

## 2023-05-13 DIAGNOSIS — G4700 Insomnia, unspecified: Secondary | ICD-10-CM | POA: Insufficient documentation

## 2023-05-13 DIAGNOSIS — F99 Mental disorder, not otherwise specified: Secondary | ICD-10-CM

## 2023-05-13 DIAGNOSIS — R519 Headache, unspecified: Secondary | ICD-10-CM

## 2023-05-13 DIAGNOSIS — D508 Other iron deficiency anemias: Secondary | ICD-10-CM | POA: Diagnosis not present

## 2023-05-13 DIAGNOSIS — F3341 Major depressive disorder, recurrent, in partial remission: Secondary | ICD-10-CM

## 2023-05-13 DIAGNOSIS — F5105 Insomnia due to other mental disorder: Secondary | ICD-10-CM

## 2023-05-13 DIAGNOSIS — R739 Hyperglycemia, unspecified: Secondary | ICD-10-CM

## 2023-05-13 MED ORDER — GUANFACINE HCL ER 1 MG PO TB24: 1.0000 mg | ORAL_TABLET | Freq: Every day | ORAL | 0 refills | Status: DC

## 2023-05-13 NOTE — Assessment & Plan Note (Signed)
 Will see if this improves by starting her on Intuniv  for her ADHD Continue melatonin as needed

## 2023-05-13 NOTE — Assessment & Plan Note (Signed)
 Encouraged diet and exercise for weight loss ?

## 2023-05-13 NOTE — Assessment & Plan Note (Signed)
 Encouraged high fiber diet and adequate water intake Continue linzess  as prescribed

## 2023-05-13 NOTE — Assessment & Plan Note (Signed)
 Will trial Intuniv  1 mg

## 2023-05-13 NOTE — Assessment & Plan Note (Signed)
Not medicated Support offered 
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
## 2023-05-13 NOTE — Patient Instructions (Signed)
 Insomnia Insomnia is a sleep disorder that makes it difficult to fall asleep or stay asleep. Insomnia can cause fatigue, low energy, difficulty concentrating, mood swings, and poor performance at work or school. There are three different ways to classify insomnia: Difficulty falling asleep. Difficulty staying asleep. Waking up too early in the morning. Any type of insomnia can be long-term (chronic) or short-term (acute). Both are common. Short-term insomnia usually lasts for 3 months or less. Chronic insomnia occurs at least three times a week for longer than 3 months. What are the causes? Insomnia may be caused by another condition, situation, or substance, such as: Having certain mental health conditions, such as anxiety and depression. Using caffeine, alcohol, tobacco, or drugs. Having gastrointestinal conditions, such as gastroesophageal reflux disease (GERD). Having certain medical conditions. These include: Asthma. Alzheimer's disease. Stroke. Chronic pain. An overactive thyroid gland (hyperthyroidism). Other sleep disorders, such as restless legs syndrome and sleep apnea. Menopause. Sometimes, the cause of insomnia may not be known. What increases the risk? Risk factors for insomnia include: Gender. Females are affected more often than males. Age. Insomnia is more common as people get older. Stress and certain medical and mental health conditions. Lack of exercise. Having an irregular work schedule. This may include working night shifts and traveling between different time zones. What are the signs or symptoms? If you have insomnia, the main symptom is having trouble falling asleep or having trouble staying asleep. This may lead to other symptoms, such as: Feeling tired or having low energy. Feeling nervous about going to sleep. Not feeling rested in the morning. Having trouble concentrating. Feeling irritable, anxious, or depressed. How is this diagnosed? This condition  may be diagnosed based on: Your symptoms and medical history. Your health care provider may ask about: Your sleep habits. Any medical conditions you have. Your mental health. A physical exam. How is this treated? Treatment for insomnia depends on the cause. Treatment may focus on treating an underlying condition that is causing the insomnia. Treatment may also include: Medicines to help you sleep. Counseling or therapy. Lifestyle adjustments to help you sleep better. Follow these instructions at home: Eating and drinking  Limit or avoid alcohol, caffeinated beverages, and products that contain nicotine and tobacco, especially close to bedtime. These can disrupt your sleep. Do not eat a large meal or eat spicy foods right before bedtime. This can lead to digestive discomfort that can make it hard for you to sleep. Sleep habits  Keep a sleep diary to help you and your health care provider figure out what could be causing your insomnia. Write down: When you sleep. When you wake up during the night. How well you sleep and how rested you feel the next day. Any side effects of medicines you are taking. What you eat and drink. Make your bedroom a dark, comfortable place where it is easy to fall asleep. Put up shades or blackout curtains to block light from outside. Use a white noise machine to block noise. Keep the temperature cool. Limit screen use before bedtime. This includes: Not watching TV. Not using your smartphone, tablet, or computer. Stick to a routine that includes going to bed and waking up at the same times every day and night. This can help you fall asleep faster. Consider making a quiet activity, such as reading, part of your nighttime routine. Try to avoid taking naps during the day so that you sleep better at night. Get out of bed if you are still awake after  15 minutes of trying to sleep. Keep the lights down, but try reading or doing a quiet activity. When you feel  sleepy, go back to bed. General instructions Take over-the-counter and prescription medicines only as told by your health care provider. Exercise regularly as told by your health care provider. However, avoid exercising in the hours right before bedtime. Use relaxation techniques to manage stress. Ask your health care provider to suggest some techniques that may work well for you. These may include: Breathing exercises. Routines to release muscle tension. Visualizing peaceful scenes. Make sure that you drive carefully. Do not drive if you feel very sleepy. Keep all follow-up visits. This is important. Contact a health care provider if: You are tired throughout the day. You have trouble in your daily routine due to sleepiness. You continue to have sleep problems, or your sleep problems get worse. Get help right away if: You have thoughts about hurting yourself or someone else. Get help right away if you feel like you may hurt yourself or others, or have thoughts about taking your own life. Go to your nearest emergency room or: Call 911. Call the National Suicide Prevention Lifeline at (906)021-1611 or 988. This is open 24 hours a day. Text the Crisis Text Line at 325-069-2793. Summary Insomnia is a sleep disorder that makes it difficult to fall asleep or stay asleep. Insomnia can be long-term (chronic) or short-term (acute). Treatment for insomnia depends on the cause. Treatment may focus on treating an underlying condition that is causing the insomnia. Keep a sleep diary to help you and your health care provider figure out what could be causing your insomnia. This information is not intended to replace advice given to you by your health care provider. Make sure you discuss any questions you have with your health care provider. Document Revised: 12/19/2020 Document Reviewed: 12/19/2020 Elsevier Patient Education  2024 [employer].

## 2023-05-13 NOTE — Assessment & Plan Note (Signed)
 Continue ibuprofen as needed She no longer follows with neurology

## 2023-05-13 NOTE — Assessment & Plan Note (Signed)
CBC today She will continue to follow with hematology

## 2023-05-13 NOTE — Assessment & Plan Note (Signed)
 No reoccurrence We will monitor

## 2023-05-14 ENCOUNTER — Encounter: Payer: Self-pay | Admitting: Internal Medicine

## 2023-05-14 LAB — COMPREHENSIVE METABOLIC PANEL WITH GFR
AG Ratio: 1.6 (calc) (ref 1.0–2.5)
ALT: 8 U/L (ref 6–29)
AST: 11 U/L (ref 10–30)
Albumin: 4.5 g/dL (ref 3.6–5.1)
Alkaline phosphatase (APISO): 77 U/L (ref 31–125)
BUN: 11 mg/dL (ref 7–25)
CO2: 24 mmol/L (ref 20–32)
Calcium: 9.4 mg/dL (ref 8.6–10.2)
Chloride: 104 mmol/L (ref 98–110)
Creat: 0.81 mg/dL (ref 0.50–0.96)
Globulin: 2.9 g/dL (ref 1.9–3.7)
Glucose, Bld: 91 mg/dL (ref 65–99)
Potassium: 3.8 mmol/L (ref 3.5–5.3)
Sodium: 136 mmol/L (ref 135–146)
Total Bilirubin: 0.4 mg/dL (ref 0.2–1.2)
Total Protein: 7.4 g/dL (ref 6.1–8.1)
eGFR: 106 mL/min/{1.73_m2} (ref >=60)

## 2023-05-14 LAB — HEMOGLOBIN A1C
Hgb A1c MFr Bld: 4.9 % (ref <5.7)
Mean Plasma Glucose: 94 mg/dL
eAG (mmol/L): 5.2 mmol/L

## 2023-05-14 LAB — CBC
HCT: 37.3 % (ref 35.0–45.0)
Hemoglobin: 12.1 g/dL (ref 11.7–15.5)
MCH: 31.3 pg (ref 27.0–33.0)
MCHC: 32.4 g/dL (ref 32.0–36.0)
MCV: 96.4 fL (ref 80.0–100.0)
MPV: 10.4 fL (ref 7.5–12.5)
Platelets: 283 10*3/uL (ref 140–400)
RBC: 3.87 10*6/uL (ref 3.80–5.10)
RDW: 12.9 % (ref 11.0–15.0)
WBC: 4.9 10*3/uL (ref 3.8–10.8)

## 2023-05-14 LAB — LIPID PANEL
Cholesterol: 152 mg/dL (ref <200)
HDL: 54 mg/dL (ref >=50)
LDL Cholesterol (Calc): 83 mg/dL
Non-HDL Cholesterol (Calc): 98 mg/dL (ref <130)
Total CHOL/HDL Ratio: 2.8 (calc) (ref <5.0)
Triglycerides: 66 mg/dL (ref <150)

## 2023-06-04 ENCOUNTER — Other Ambulatory Visit: Payer: Self-pay | Admitting: Internal Medicine

## 2023-06-06 NOTE — Telephone Encounter (Signed)
 Requested Prescriptions  Pending Prescriptions Disp Refills   guanFACINE  (INTUNIV ) 1 MG TB24 ER tablet [Pharmacy Med Name: GUANFACINE  HCL ER 1 MG TABLET] 90 tablet 1    Sig: TAKE 1 TABLET BY MOUTH EVERY DAY     Cardiovascular: Alpha-2 Agonists - guanfacine  Passed - 06/06/2023  9:35 AM      Passed - Last BP in normal range    BP Readings from Last 1 Encounters:  05/13/23 118/78         Passed - Last Heart Rate in normal range    Pulse Readings from Last 1 Encounters:  11/08/22 88         Passed - Valid encounter within last 6 months    Recent Outpatient Visits           3 weeks ago Other iron deficiency anemia   [facility] [place], [place], [place]

## 2023-07-16 ENCOUNTER — Encounter: Payer: Self-pay | Admitting: Internal Medicine

## 2023-07-16 DIAGNOSIS — R519 Headache, unspecified: Secondary | ICD-10-CM

## 2023-07-18 ENCOUNTER — Encounter: Payer: Self-pay | Admitting: Internal Medicine

## 2023-11-04 ENCOUNTER — Encounter: Payer: Self-pay | Admitting: Internal Medicine

## 2023-11-05 DIAGNOSIS — K056 Periodontal disease, unspecified: Secondary | ICD-10-CM | POA: Insufficient documentation

## 2023-11-11 ENCOUNTER — Telehealth: Payer: Self-pay

## 2023-11-11 NOTE — Telephone Encounter (Signed)
 Former patient of Dr. Geraldene contacted office to request a refill for her Linzess  medication to be sent to [facility] in [place].    Thanks,  [place], CMA

## 2023-11-12 ENCOUNTER — Ambulatory Visit (INDEPENDENT_AMBULATORY_CARE_PROVIDER_SITE_OTHER): Payer: MEDICAID | Admitting: Internal Medicine

## 2023-11-12 ENCOUNTER — Encounter: Payer: Self-pay | Admitting: Internal Medicine

## 2023-11-12 VITALS — BP 110/84 | Ht 71.5 in | Wt 235.2 lb

## 2023-11-12 DIAGNOSIS — E6609 Other obesity due to excess calories: Secondary | ICD-10-CM | POA: Diagnosis not present

## 2023-11-12 DIAGNOSIS — Z0001 Encounter for general adult medical examination with abnormal findings: Secondary | ICD-10-CM | POA: Diagnosis not present

## 2023-11-12 DIAGNOSIS — Z6832 Body mass index (BMI) 32.0-32.9, adult: Secondary | ICD-10-CM

## 2023-11-12 DIAGNOSIS — E66811 Obesity, class 1: Secondary | ICD-10-CM

## 2023-11-12 DIAGNOSIS — Z23 Encounter for immunization: Secondary | ICD-10-CM
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
## 2023-11-12 NOTE — Assessment & Plan Note (Signed)
 Encouraged diet and exercise for weight loss ?

## 2023-11-12 NOTE — Patient Instructions (Signed)

## 2023-11-14 ENCOUNTER — Other Ambulatory Visit: Payer: Self-pay

## 2023-11-14 MED ORDER — LINACLOTIDE 290 MCG PO CAPS: 290.0000 ug | ORAL_CAPSULE | Freq: Every day | ORAL | 1 refills | Status: AC

## 2023-12-03 NOTE — Progress Notes (Unsigned)
 Will preauthorize and order a brain MRI w and wo to evaluate for any structural abnormalities that are contributing to headaches.   - Medication rebound headache (also known as medication overuse headache) can happen due to frequent use of rescue headache medications (e.g. NSAIDs, opioids, butalbitol preparations, Tylenol , triptans etc). We do not want you taking any over the counter pain medications. Stopping of medications may make headaches worse at first and then take a couple weeks to 2 months before significant relief becomes apparent. In general no one should be taking over the counter pain medication more than twice per week.

## 2023-12-04 ENCOUNTER — Ambulatory Visit: Admitting: Neurology

## 2023-12-05 ENCOUNTER — Encounter: Payer: Self-pay | Admitting: Neurology

## 2023-12-05 ENCOUNTER — Ambulatory Visit: Payer: MEDICAID | Admitting: Neurology

## 2023-12-05 VITALS — BP 126/86 | HR 95 | Ht 71.5 in | Wt 239.5 lb

## 2023-12-05 DIAGNOSIS — Q040 Congenital malformations of corpus callosum: Secondary | ICD-10-CM | POA: Diagnosis not present

## 2023-12-05 DIAGNOSIS — R9089 Other abnormal findings on diagnostic imaging of central nervous system: Secondary | ICD-10-CM

## 2023-12-05 DIAGNOSIS — G43001 Migraine without aura, not intractable, with status migrainosus: Secondary | ICD-10-CM | POA: Diagnosis not present

## 2023-12-05 DIAGNOSIS — G44221 Chronic tension-type headache, intractable: Secondary | ICD-10-CM

## 2023-12-05 DIAGNOSIS — S06369S Traumatic hemorrhage of cerebrum, unspecified, with loss of consciousness of unspecified duration, sequela: Secondary | ICD-10-CM

## 2023-12-05 MED ORDER — TOPIRAMATE 25 MG PO TABS: 25.0000 mg | ORAL_TABLET | Freq: Every day | ORAL | 3 refills | Status: AC

## 2023-12-05 NOTE — Patient Instructions (Addendum)
 Assessment: Total time for face to face interview and examination, for review of  images and laboratory testing, neurophysiology testing and pre-existing records, including out-of -network , was  total time : 60 minutes. Assessment is as follows here:   1)   non -specific headache disorder,  onset 4 years ago - but we can't pin point a TBI or infection at onset. 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 Impaired hearing.  3) morning headaches almost daily- but she is not snoring according to mother.  She does wake up with a dry mouth.  Nocturia 2 times or more.  4_ HA due to psychological causes.  Stress due to being out of work. ADHD/ learning disabilities.  Memory deficits ( amnestic )  5. No anemia at this time to explain fatigue.    She failed Nortryptiline, Medrol, Effexor .    Plan:  Treatment plan and additional workup planned after today includes:  She has to set bedtime and a rise time-  she has no routines, may go to sleep at 3 AM and wakes at 12 noon. Is on the phone a lot.  She has broken up contact with her therapist and continued care was explicitly requested.  We don't need to repeat MRI.      1)  establish with a psychiatrist - you need to have supervision, goal setting, establish routines.      2)  preventing migraines :Start Topiramate  25 mg daily in pm, intake at 10 PM. not BP active ( lightheadedness) . Keep 30 days before deciding to increase.    3)  order HST ,  morning headaches and ESS - she has a different circadian rhythm and is not likely to sleep in the sleep lab.  Risk factors for OSA are present.      RV in 6 -9 months with headache journal. .   Topiramate Tablets What is this medication? TOPIRAMATE (toe PYRE a mate) prevents and controls seizures in people with epilepsy. It may also be used to prevent migraine headaches. It  works by calming overactive nerves in your body. This medicine may be used for other purposes; ask your health care provider or pharmacist if you have questions. COMMON BRAND NAME(S): Topamax, Topiragen What should I tell my care team before I take this medication? They need to know if you have any of these conditions: Bleeding disorder Kidney disease Lung disease Suicidal thoughts, plans, or attempt by you or a family member An unusual or allergic reaction to topiramate, other medications, foods, dyes, or preservatives Pregnant or trying to get pregnant Breast-feeding How should I use this medication? Take this medication by mouth with water. Take it as directed on the prescription label at the same time every day. Do not cut, crush or chew this medicine. Swallow the tablets whole. You can take it with or without food. If it upsets your stomach, take it with food. Keep taking it unless your care team tells you to stop. A special MedGuide will be given to you by the pharmacist with each prescription and refill. Be sure to read this information carefully each time. Talk to your care team about the use of this medication in children. While it may be prescribed for children as young as 2 years for selected conditions, precautions do apply. Overdosage: If you think you have taken too much of  this medicine contact a poison control center or emergency room at once. NOTE: This medicine is only for you. Do not share this medicine with others. What if I miss a dose? If you miss a dose, take it as soon as you can unless it is within 6 hours of the next dose. If it is within 6 hours of the next dose, skip the missed dose. Take the next dose at the normal time. Do not take double or extra doses. What may interact with this medication? Acetazolamide Alcohol Antihistamines for allergy, cough, and cold Aspirin and aspirin-like medications Atropine Certain medications for anxiety or sleep Certain  medications for bladder problems, such as oxybutynin, tolterodine Certain medications for depression, such as amitriptyline, fluoxetine, sertraline  Certain medications for Parkinson disease, such as benztropine, trihexyphenidyl Certain medications for seizures, such as carbamazepine, lamotrigine, phenobarbital, phenytoin , primidone, valproic acid, zonisamide Certain medications for stomach problems, such as dicyclomine, hyoscyamine Certain medications for travel sickness, such as scopolamine Certain medications that treat or prevent blood clots, such as warfarin, enoxaparin, dalteparin, apixaban, dabigatran, rivaroxaban Digoxin Diltiazem Estrogen and progestin hormones General anesthetics, such as halothane, isoflurane, methoxyflurane, propofol  Glyburide Hydrochlorothiazide Ipratropium Lithium Medications that relax muscles Metformin NSAIDs, medications for pain and inflammation, such as ibuprofen or naproxen Opioid medications for pain Phenothiazines, such as chlorpromazine, mesoridazine, prochlorperazine, thioridazine Pioglitazone This list may not describe all possible interactions. Give your health care provider a list of all the medicines, herbs, non-prescription drugs, or dietary supplements you use. Also tell them if you smoke, drink alcohol, or use illegal drugs. Some items may interact with your medicine. What should I watch for while using this medication? Visit your care team for regular checks on your progress. Tell your care team if your symptoms do not start to get better or if they get worse. Do not suddenly stop taking this medication. You may develop a severe reaction. Your care team will tell you how much medication to take. If your care team wants you to stop the medication, the dose may be slowly lowered over time to avoid any side effects. Wear a medical ID bracelet or chain. Carry a card that describes your condition. List the medications and doses you take on the  card. This medication may affect your coordination, reaction time, or judgment. Do not drive or operate machinery until you know how this medication affects you. Sit up or stand slowly to reduce the risk of dizzy or fainting spells. Drinking alcohol with this medication can increase the risk of these side effects. This medication may cause serious skin reactions. They can happen weeks to months after starting the medication. Contact your care team right away if you notice fevers or flu-like symptoms with a rash. The rash may be red or purple and then turn into blisters or peeling of the skin. You may also notice a red rash with swelling of the face, lips, or lymph nodes in your neck or under your arms. This medication may cause thoughts of suicide or depression. This includes sudden changes in mood, behaviors, or thoughts. These changes can happen at any time but are more common in the beginning of treatment or after a change in dose. Call your care team right away if you experience these thoughts or worsening depression. This medication may slow your child's growth if it is taken for a long time at high doses. Your child's care team will monitor your child's growth. Using this medication for a long time may weaken your bones.  The risk of bone fractures may be increased. Talk to your care team about your bone health. Discuss this medication with your care team if you may be pregnant. Serious birth defects can occur if you take this medication during pregnancy. There are benefits and risks to taking medications during pregnancy. Your care team can help you find the option that works for you. Contraception is recommended while taking this medication. Estrogen and progestin hormones may not work as well while you are taking this medication. Your care team can help you find the option that works for you. Talk to your care team before breastfeeding. Changes to your treatment plan may be needed. What side effects  may I notice from receiving this medication? Side effects that you should report to your care team as soon as possible: Allergic reactions--skin rash, itching, hives, swelling of the face, lips, tongue, or throat High acid level--trouble breathing, unusual weakness or fatigue, confusion, headache, fast or irregular heartbeat, nausea, vomiting High ammonia level--unusual weakness or fatigue, confusion, loss of appetite, nausea, vomiting, seizures Fever that does not go away, decrease in sweat Kidney stones--blood in the urine, pain or trouble passing urine, pain in the lower back or sides Redness, blistering, peeling or loosening of the skin, including inside the mouth Sudden eye pain or change in vision such as blurry vision, seeing halos around lights, vision loss Thoughts of suicide or self-harm, worsening mood, feelings of depression Side effects that usually do not require medical attention (report to your care team if they continue or are bothersome): Burning or tingling sensation in hands or feet Difficulty with paying attention, memory, or speech Dizziness Drowsiness Fatigue Loss of appetite with weight loss Slow or sluggish movements of the body This list may not describe all possible side effects. Call your doctor for medical advice about side effects. You may report side effects to FDA at 1-800-FDA-1088. Where should I keep my medication? Keep out of the reach of children and pets. Store between 15 and 30 degrees C (59 and 86 degrees F). Protect from moisture. Keep the container tightly closed. Get rid of any unused medication after the expiration date. To get rid of medications that are no longer needed or have expired: Take the medication to a medication take-back program. Check with your pharmacy or law enforcement to find a location. If you cannot return the medication, check the label or package insert to see if the medication should be thrown out in the garbage or flushed down  the toilet. If you are not sure, ask your care team. If it is safe to put it in the trash, empty the medication out of the container. Mix the medication with cat litter, dirt, coffee grounds, or other unwanted substance. Seal the mixture in a bag or container. Put it in the trash. NOTE: This sheet is a summary. It may not cover all possible information. If you have questions about this medicine, talk to your doctor, pharmacist, or health care provider.  2024 Elsevier/Gold Standard (2021-06-01 00:00:00)

## 2023-12-07 ENCOUNTER — Other Ambulatory Visit: Payer: Self-pay | Admitting: Internal Medicine

## 2023-12-10 NOTE — Telephone Encounter (Signed)
 Requested Prescriptions  Pending Prescriptions Disp Refills   guanFACINE  (INTUNIV ) 1 MG TB24 ER tablet [Pharmacy Med Name: GUANFACINE  HCL ER 1 MG TABLET] 90 tablet 1    Sig: TAKE 1 TABLET BY MOUTH EVERY DAY     Cardiovascular: Alpha-2 Agonists - guanfacine  Passed - 12/10/2023 11:22 AM      Passed - Last BP in normal range    BP Readings from Last 1 Encounters:  12/05/23 126/86         Passed - Last Heart Rate in normal range    Pulse Readings from Last 1 Encounters:  12/05/23 95         Passed - Valid encounter within last 6 months    Recent Outpatient Visits           4 weeks ago Encounter for general adult medical examination with abnormal findings   [place] [facility] [place], Angeline ORN, NP   7 months ago Other iron deficiency anemia   [place] [facility] [place], Angeline ORN, [place]

## 2023-12-16 ENCOUNTER — Ambulatory Visit: Payer: MEDICAID | Admitting: Neurology

## 2023-12-16 DIAGNOSIS — G44221 Chronic tension-type headache, intractable: Secondary | ICD-10-CM

## 2023-12-16 DIAGNOSIS — G43001 Migraine without aura, not intractable, with status migrainosus: Secondary | ICD-10-CM

## 2023-12-16 DIAGNOSIS — R258 Other abnormal involuntary movements: Secondary | ICD-10-CM

## 2023-12-16 DIAGNOSIS — S06369S Traumatic hemorrhage of cerebrum, unspecified, with loss of consciousness of unspecified duration, sequela: Secondary | ICD-10-CM

## 2023-12-16 DIAGNOSIS — R9089 Other abnormal findings on diagnostic imaging of central nervous system: Secondary | ICD-10-CM

## 2023-12-16 DIAGNOSIS — Q040 Congenital malformations of corpus callosum: Secondary | ICD-10-CM

## 2023-12-17 ENCOUNTER — Telehealth: Payer: Self-pay | Admitting: Neurology

## 2023-12-17 DIAGNOSIS — F5104 Psychophysiologic insomnia: Secondary | ICD-10-CM

## 2023-12-17 DIAGNOSIS — F3341 Major depressive disorder, recurrent, in partial remission: Secondary | ICD-10-CM

## 2023-12-17 DIAGNOSIS — F902 Attention-deficit hyperactivity disorder, combined type: Secondary | ICD-10-CM

## 2023-12-17 NOTE — Telephone Encounter (Signed)
 Pt is Requesting referral to a therapist, she said she is in need of someone to talk to, she said it was briefly mentioned in appointment with Dr Chalice, pt asking for a referral who accepts her Medicaid

## 2023-12-17 NOTE — Addendum Note (Signed)
 Addended by: CHALICE SAUNAS on: 12/17/2023 05:01 PM   Modules accepted: Orders

## 2023-12-17 NOTE — Telephone Encounter (Signed)
 Dr. Chalice- I see you recommended she establish with psychiatrist at last visit. Are you agreeable to place referral?

## 2023-12-18 ENCOUNTER — Telehealth: Payer: Self-pay | Admitting: Neurology

## 2023-12-18 NOTE — Telephone Encounter (Signed)
 Referral for psychiatry fax to [facility] per neurologist. Phone: [phone], Fax: [phone]

## 2023-12-18 NOTE — Telephone Encounter (Signed)
 Annie/Zanobia- can you please send referral Dr. Chalice placed yesterday for [facility]? Please update pt as well, thank you

## 2023-12-18 NOTE — Telephone Encounter (Signed)
 Contacted patient and relayed referral has been sent and gave phone number to call to schedule appointment

## 2023-12-24 ENCOUNTER — Telehealth: Payer: Self-pay | Admitting: Internal Medicine

## 2023-12-24 ENCOUNTER — Ambulatory Visit: Payer: Self-pay | Admitting: Neurology

## 2023-12-24 DIAGNOSIS — S06369A Traumatic hemorrhage of cerebrum, unspecified, with loss of consciousness of unspecified duration, initial encounter: Secondary | ICD-10-CM | POA: Insufficient documentation

## 2023-12-24 DIAGNOSIS — G44221 Chronic tension-type headache, intractable: Secondary | ICD-10-CM | POA: Insufficient documentation

## 2023-12-24 DIAGNOSIS — F3341 Major depressive disorder, recurrent, in partial remission: Secondary | ICD-10-CM

## 2023-12-24 DIAGNOSIS — R9089 Other abnormal findings on diagnostic imaging of central nervous system: Secondary | ICD-10-CM | POA: Insufficient documentation

## 2023-12-24 DIAGNOSIS — G43001 Migraine without aura, not intractable, with status migrainosus: Secondary | ICD-10-CM | POA: Insufficient documentation

## 2023-12-24 DIAGNOSIS — Q040 Congenital malformations of corpus callosum: Secondary | ICD-10-CM | POA: Insufficient documentation

## 2023-12-24 NOTE — Telephone Encounter (Signed)
Referral to psychology placed.

## 2023-12-24 NOTE — Procedures (Signed)
 [facility]  EEG (ELECTROENCEPHALOGRAM) REPORT  Ms Deborah Rhodes  ORDERING CLINICIAN: Dedra Gores, M.D.  TECHNOLOGIST: Burnard Plummer , REEGT TECHNIQUE:  This EEG study's scalp electrodes were positioned according to the 10-20 International system of electrode placement. Electrical activity was reviewed with band pass filter of 1-70Hz , sensitivity of 7 uV/mm, display speed of 58mm/sec with a 60Hz  notched filter applied as appropriate. EEG data were recorded continuously and digitally stored.    Recoding duration : 26 minutes and 19 sec Activation included:  Photic stimulation and Hyperventilation .      Description: The EEG's posterior dominant background rhythm of 10 hertz was symmetrically displayed while the patient's eyes were closed  and promptly attenuated with eye opening. At baseline, the recording showed a low amplitude EEG and asymmetry. 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    Photic stimulation was initiated at frequencies from 3- through 21 hertz, resulting in photic entrainment at  all frequencies above 7 Hertz , without periodic or rhythmic discharges, or epileptiform activity.  Hyperventilation maneuver was initiated,  leading to amplitude build-up and slowing, the occipital frequency was now 8 Hertz,   Following hyperventilation the patient's EEG was reviewed for a period of 1 and 2 minutes post maneuver, and indicated  relaxation but no sleep.  EKG showed a regular heart rhythm at 58-62 bpm.   The patient was recorded while awake, while drowsy.   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  No evidence of abnormal brain wave activity aside from this was noted.      Dr. Dedra Gores, M.D. Accredited by the ABPN, ABSM.

## 2023-12-24 NOTE — Addendum Note (Signed)
 Addended by: ANTONETTE ANGELINE ORN on: 12/24/2023 12:57 PM   Modules accepted: Orders

## 2023-12-24 NOTE — Telephone Encounter (Signed)
 Copied from CRM #[id number]. Topic: Referral - Request for Referral >> Dec 24, 2023 12:31 PM Alfonso ORN wrote: Did the patient discuss referral with their provider in the last year? Yes (If No - schedule appointment) (If Yes - send message)  Appointment offered? Yes  Type of order/referral and detailed reason for visit: in person therapist   Preference of office, provider, location: close to [place] area   If referral order, have you been seen by this specialty before? Yes 3 years ago   Can we respond through MyChart? Yes

## 2023-12-31 NOTE — Telephone Encounter (Signed)
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  No abnormal EEG  rhythm or epileptiform discharges.  ----- Message ----- From: Chalice Saunas, MD Sent: 12/24/2023   6:29 PM EST To: Saunas Chalice, MD

## 2023-12-31 NOTE — Telephone Encounter (Signed)
 Spoke to patient states saw EEG results in mychart Pt states had no questions or concerns at this time . Pt thanked me for calling

## 2024-01-08 ENCOUNTER — Ambulatory Visit: Payer: MEDICAID | Admitting: Neurology

## 2024-01-08 DIAGNOSIS — G4710 Hypersomnia, unspecified: Secondary | ICD-10-CM

## 2024-01-08 DIAGNOSIS — Q040 Congenital malformations of corpus callosum: Secondary | ICD-10-CM

## 2024-01-08 DIAGNOSIS — G44221 Chronic tension-type headache, intractable: Secondary | ICD-10-CM

## 2024-01-08 DIAGNOSIS — G43001 Migraine without aura, not intractable, with status migrainosus: Secondary | ICD-10-CM
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
## 2024-01-20 MED ORDER — LINACLOTIDE 290 MCG PO CAPS: 290.0000 ug | ORAL_CAPSULE | Freq: Every day | ORAL | 1 refills | Status: DC

## 2024-01-20 NOTE — Telephone Encounter (Signed)
 Patient called to schedule an appointment for medication refill. Appointment was scheduled with Mrs. Edwards on 02/28/24 at 9:00 AM.

## 2024-01-20 NOTE — Telephone Encounter (Signed)
Rx sent through e-scribe  

## 2024-01-20 NOTE — Addendum Note (Signed)
 Addended by: LANNIE ANDREA GRADE on: 01/20/2024 02:54 PM   Modules accepted: Orders

## 2024-01-27 ENCOUNTER — Other Ambulatory Visit: Payer: Self-pay

## 2024-01-27 ENCOUNTER — Telehealth: Payer: Self-pay | Admitting: Neurology

## 2024-01-27 ENCOUNTER — Encounter: Payer: Self-pay | Admitting: Emergency Medicine

## 2024-01-27 ENCOUNTER — Ambulatory Visit: Payer: Self-pay

## 2024-01-27 ENCOUNTER — Emergency Department
Admission: EM | Admit: 2024-01-27 | Discharge: 2024-01-27 | Disposition: A | Discharge status: Home / Self Care | Payer: MEDICAID | Attending: Emergency Medicine | Admitting: Emergency Medicine

## 2024-01-27 ENCOUNTER — Emergency Department: Payer: MEDICAID

## 2024-01-27 DIAGNOSIS — R0789 Other chest pain: Secondary | ICD-10-CM | POA: Insufficient documentation

## 2024-01-27 DIAGNOSIS — R059 Cough, unspecified: Secondary | ICD-10-CM | POA: Insufficient documentation

## 2024-01-27 DIAGNOSIS — R079 Chest pain, unspecified: Secondary | ICD-10-CM

## 2024-01-27 LAB — CBC
HCT: 34.6 % — ABNORMAL LOW (ref 36.0–46.0)
Hemoglobin: 11.2 g/dL — ABNORMAL LOW (ref 12.0–15.0)
MCH: 28.1 pg (ref 26.0–34.0)
MCHC: 32.4 g/dL (ref 30.0–36.0)
MCV: 86.9 fL (ref 80.0–100.0)
Platelets: 266 K/uL (ref 150–400)
RBC: 3.98 MIL/uL (ref 3.87–5.11)
RDW: 13.8 % (ref 11.5–15.5)
WBC: 6.2 K/uL (ref 4.0–10.5)
nRBC: 0 % (ref 0.0–0.2)

## 2024-01-27 LAB — BASIC METABOLIC PANEL WITH GFR
Anion gap: 14 (ref 5–15)
BUN: 12 mg/dL (ref 6–20)
CO2: 19 mmol/L — ABNORMAL LOW (ref 22–32)
Calcium: 9.1 mg/dL (ref 8.9–10.3)
Chloride: 101 mmol/L (ref 98–111)
Creatinine, Ser: 0.96 mg/dL (ref 0.44–1.00)
GFR, Estimated: >60 mL/min
Glucose, Bld: 85 mg/dL (ref 70–99)
Potassium: 3.9 mmol/L (ref 3.5–5.1)
Sodium: 133 mmol/L — ABNORMAL LOW (ref 135–145)

## 2024-01-27 LAB — POC URINE PREG, ED: Preg Test, Ur: NEGATIVE

## 2024-01-27 LAB — TROPONIN T, HIGH SENSITIVITY: Troponin T High Sensitivity: <15 ng/L (ref 0–19)

## 2024-01-27 NOTE — Telephone Encounter (Signed)
Pt is asking for a call with results to her sleep study

## 2024-01-27 NOTE — ED Notes (Signed)
 EDT unsuccessful at blood draw; lab notified to collect specimens.

## 2024-01-27 NOTE — Telephone Encounter (Signed)
 FYI Only or Action Required?: FYI only for provider: ED advised.  Patient was last seen in primary care on 11/12/2023 by Antonette Angeline ORN, NP.  Called Nurse Triage reporting Chest Pain.  Symptoms began several days ago.  Interventions attempted: Rest, hydration, or home remedies.  Symptoms are: unchanged.  Triage Disposition: Go to ED Now (Notify PCP)  Patient/caregiver understands and will follow disposition?: Yes   Copied from CRM [id number]. Topic: Clinical - Red Word Triage >> Jan 27, 2024  8:23 AM Wyona SQUIBB wrote: Red Word that prompted transfer to Nurse Triage: MRN: [id number] - pt called in with sharp chest pain Reason for Disposition  SEVERE chest pain  Answer Assessment - Initial Assessment Questions Unable to complete triage as ED disposition advised based on pt's symptoms. Pt reports she has been experiencing sharp intense CP since 0200 Saturday, pt notes she is also experiencing periods of SOB or feeling it is very difficult or painful to breathe. Advised ED with another adult to drive, pt agreeable with education.  Protocols used: Chest Pain-A-AH

## 2024-01-27 NOTE — ED Provider Notes (Signed)
 "  [facility] Provider Note    Event Date/Time   First MD Initiated Contact with Patient 01/27/24 1723     (approximate)   History   Chief Complaint Chest Pain   HPI  Deborah Rhodes is a 24 y.o. female with past medical history of iron deficiency anemia and migraines who presents to the ED complaint of chest pain.  Patient reports that she has had 2 days of constant sharp pain in the center of her chest.  Pain does not seem to be exacerbated or alleviated by anything in particular, she does endorse a cough but denies any fevers or difficulty breathing.  She has not noticed any pain or swelling in her legs and does not take an OCP.  She denies any history of similar symptoms.     Physical Exam   Triage Vital Signs: ED Triage Vitals  Encounter Vitals Group     BP 01/27/24 1547 (!) 132/92     Girls Systolic BP Percentile --      Girls Diastolic BP Percentile --      Boys Systolic BP Percentile --      Boys Diastolic BP Percentile --      Pulse Rate 01/27/24 1547 81     Resp 01/27/24 1547 16     Temp 01/27/24 1547 97.7 F (36.5 C)     Temp Source 01/27/24 1547 Oral     SpO2 01/27/24 1547 99 %     Weight 01/27/24 1548 230 lb (104.3 kg)     Height 01/27/24 1548 5' 11 (1.803 m)     Head Circumference --      Peak Flow --      Pain Score 01/27/24 1548 8     Pain Loc --      Pain Education --      Exclude from Growth Chart --     Most recent vital signs: Vitals:   01/27/24 1547 01/27/24 1941  BP: (!) 132/92 116/88  Pulse: 81 65  Resp: 16 16  Temp: 97.7 F (36.5 C) 97.8 F (36.6 C)  SpO2: 99% 100%    Constitutional: Alert and oriented. Eyes: Conjunctivae are normal. Head: Atraumatic. Nose: No congestion/rhinnorhea. Mouth/Throat: Mucous membranes are moist.  Cardiovascular: Normal rate, regular rhythm. Grossly normal heart sounds.  2+ radial pulses bilaterally. Respiratory: Normal respiratory effort.  No retractions. Lungs CTAB.   No chest wall tenderness to palpation noted. Gastrointestinal: Soft and nontender. No distention. Musculoskeletal: No lower extremity tenderness nor edema.  Neurologic:  Normal speech and language. No gross focal neurologic deficits are appreciated.    ED Results / Procedures / Treatments   Labs (all labs ordered are listed, but only abnormal results are displayed) Labs Reviewed  BASIC METABOLIC PANEL WITH GFR - Abnormal; Notable for the following components:      Result Value   Sodium 133 (*)    CO2 19 (*)    All other components within normal limits  CBC - Abnormal; Notable for the following components:   Hemoglobin 11.2 (*)    HCT 34.6 (*)    All other components within normal limits  POC URINE PREG, ED  TROPONIN T, HIGH SENSITIVITY     EKG  ED ECG REPORT I, Carlin Palin, the attending physician, personally viewed and interpreted this ECG.   Date: 01/27/2024  EKG Time: 15:49  Rate: 87  Rhythm: normal sinus rhythm  Axis: Normal  Intervals:none  ST&T Change: Nonspecific T wave abnormality  RADIOLOGY Chest x-ray reviewed and interpreted by me with no infiltrate, edema, or effusion.  PROCEDURES:  Critical Care performed: No  Procedures   MEDICATIONS ORDERED IN ED: Medications - No data to display   IMPRESSION / MDM / ASSESSMENT AND PLAN / ED COURSE  I reviewed the triage vital signs and the nursing notes.                              24 y.o. female with past medical history of iron deficiency anemia and migraines who presents to the ED complaining of 2 days of constant sharp pain in the center of her chest.  Patient's presentation is most consistent with acute presentation with potential threat to life or bodily function.  Differential diagnosis includes, but is not limited to, ACS, PE, pneumonia, pneumothorax, musculoskeletal pain, GERD, anxiety, viral syndrome.  Patient well-appearing and in no acute distress, vital signs are unremarkable.  EKG shows  no evidence of arrhythmia or ischemia and symptoms seem atypical for ACS.  Troponin within normal limits and I doubt PE as patient is PERC negative.  Chest x-ray is unremarkable and additional labs without significant anemia, leukocytosis, electrolyte abnormality, or AKI.  Suspect possible viral illness given her recent cough.  Patient appropriate for discharge with outpatient follow-up, was counseled to return to the ED for new or worsening symptoms.  Patient agrees with plan.      FINAL CLINICAL IMPRESSION(S) / ED DIAGNOSES   Final diagnoses:  Nonspecific chest pain     Rx / DC Orders   ED Discharge Orders     None        Note:  This document was prepared using Dragon voice recognition software and may include unintentional dictation errors.   Willo Dunnings, MD 01/27/24 2008  "

## 2024-01-27 NOTE — ED Triage Notes (Signed)
 Pt in via POV, reports left side chest pain w/ shortness x few days.  Seen at Urgent Care today w/ unremarkable chest xray and EKG, advised to be evaluated further in ED.  Ambulatory to triage, NAD noted at this time.

## 2024-01-27 NOTE — Telephone Encounter (Signed)
 Agree with advice given
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
## 2024-01-28 NOTE — Telephone Encounter (Signed)
Please result when available  

## 2024-01-29 ENCOUNTER — Encounter: Payer: Self-pay | Admitting: Internal Medicine

## 2024-01-29 DIAGNOSIS — H9190 Unspecified hearing loss, unspecified ear: Secondary | ICD-10-CM

## 2024-01-29 DIAGNOSIS — Z461 Encounter for fitting and adjustment of hearing aid: Secondary | ICD-10-CM

## 2024-01-30 NOTE — Telephone Encounter (Signed)
 I called the patient and left a message to call the office back about HST results. Patient did review results via mychart.

## 2024-02-03 NOTE — Telephone Encounter (Signed)
 Called and informed pt. Pt verbalized understanding.

## 2024-02-07 ENCOUNTER — Encounter: Payer: Self-pay | Admitting: Cardiology

## 2024-02-07 ENCOUNTER — Ambulatory Visit: Payer: MEDICAID | Attending: Cardiology | Admitting: Cardiology

## 2024-02-07 VITALS — BP 110/77 | HR 84 | Ht 71.5 in | Wt 229.6 lb

## 2024-02-07 DIAGNOSIS — R072 Precordial pain: Secondary | ICD-10-CM

## 2024-02-07 DIAGNOSIS — R079 Chest pain, unspecified: Secondary | ICD-10-CM

## 2024-02-07 MED ORDER — METOPROLOL TARTRATE 100 MG PO TABS: ORAL_TABLET | ORAL | 0 refills | Status: DC

## 2024-02-07 NOTE — Patient Instructions (Signed)
 Medication Instructions:  - take 100 mg metoprolol  two hours prior to cardiac CTA   *If you need a refill on your cardiac medications before your next appointment, please call your pharmacy*  Lab Work: Your provider would like for you to have following labs drawn today BMP.    If you have labs (blood work) drawn today and your tests are completely normal, you will receive your results only by: MyChart Message (if you have MyChart) OR A paper copy in the mail If you have any lab test that is abnormal or we need to change your treatment, we will call you to review the results.  Testing/Procedures:   Your cardiac CT will be scheduled at one of the below locations:    [facility] [address] [place], [place] [address] [phone]  OR   [facility] [address]  [place], [place] [address]  If scheduled at the Heart and Vascular [facility] at [address], please enter the parking lot using the [address] entrance and use the FREE valet service at the patient drop-off area. Enter the building and check-in with registration on the main floor.  If scheduled at [facility], please arrive to the Heart and Vascular Center 15 mins early for check-in and test prep.  There is spacious parking and easy access to the radiology department from the [facility] entrance. Please enter here and check-in with the desk attendant.   Please follow these instructions carefully (unless otherwise directed):  An IV will be required for this test and Nitroglycerin will be given.  Hold all erectile dysfunction medications at least 3 days (72 hrs) prior to test. (Ie viagra, cialis, sildenafil, tadalafil, etc)   On the Night Before the Test: Be sure to Drink plenty of water. Do not consume any caffeinated/decaffeinated beverages or chocolate 12 hours prior to your test. Do not take any antihistamines 12 hours  prior to your test.  On the Day of the Test: Drink plenty of water until 1 hour prior to the test. Do not eat any food 1 hour prior to test. You may take your regular medications prior to the test.  Take metoprolol  (Lopressor ) two hours prior to test. FEMALES- please wear underwire-free bra if available, avoid dresses & tight clothing      After the Test: Drink plenty of water. After receiving IV contrast, you may experience a mild flushed feeling. This is normal. On occasion, you may experience a mild rash up to 24 hours after the test. This is not dangerous. If this occurs, you can take Benadryl 25 mg, Zyrtec, Claritin, or Allegra and increase your fluid intake. (Patients taking Tikosyn should avoid Benadryl, and may take Zyrtec, Claritin, or Allegra) If you experience trouble breathing, this can be serious. If it is severe call 911 IMMEDIATELY. If it is mild, please call our office.  We will call to schedule your test 2-4 weeks out understanding that some insurance companies will need an authorization prior to the service being performed.   For more information and frequently asked questions, please visit our website : [email or web address]  For non-scheduling related questions, please contact the cardiac imaging nurse navigator should you have any questions/concerns: Cardiac Imaging Nurse Navigators Direct Office Dial: [phone]   For scheduling needs, including cancellations and rescheduling, please call Brittany, [phone].   Follow-Up: At [facility], you and your health needs are our priority.  As part of our  continuing mission to provide you with exceptional heart care, our providers are all part of one team.  This team includes your primary Cardiologist (physician) and Advanced Practice Providers or APPs (Physician Assistants and Nurse Practitioners) who all work together to provide you with the care you need, when you need it.  Your next appointment:    2 month(s)  Provider:   You may see Redell Cave, MD or one of the following Advanced Practice Providers on your designated Care Team:   Lonni Meager, NP Lesley Maffucci, PA-C Bernardino Bring, PA-C Cadence [place], PA-C Tylene Lunch, NP Barnie Hila, NP    We recommend signing up for the patient portal called MyChart.  Sign up information is provided on this After Visit Summary.  MyChart is used to connect with patients for Virtual Visits (Telemedicine).  Patients are able to view lab/test results, encounter notes, upcoming appointments, etc.  Non-urgent messages can be sent to your provider as well.   To learn more about what you can do with MyChart, go to [email or web address].
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
## 2024-02-08 LAB — BASIC METABOLIC PANEL WITH GFR
BUN/Creatinine Ratio: 16 (ref 9–23)
BUN: 16 mg/dL (ref 6–20)
CO2: 16 mmol/L — ABNORMAL LOW (ref 20–29)
Calcium: 9.3 mg/dL (ref 8.7–10.2)
Chloride: 107 mmol/L — ABNORMAL HIGH (ref 96–106)
Creatinine, Ser: 1.02 mg/dL — ABNORMAL HIGH (ref 0.57–1.00)
Glucose: 111 mg/dL — ABNORMAL HIGH (ref 70–99)
Potassium: 3.8 mmol/L (ref 3.5–5.2)
Sodium: 138 mmol/L (ref 134–144)
eGFR: 80 mL/min/1.73

## 2024-02-18 ENCOUNTER — Ambulatory Visit: Payer: MEDICAID | Admitting: Clinical

## 2024-02-18 DIAGNOSIS — F4323 Adjustment disorder with mixed anxiety and depressed mood: Secondary | ICD-10-CM | POA: Diagnosis not present

## 2024-02-18 NOTE — Progress Notes (Unsigned)
   Darice Seats, LCSW
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
## 2024-02-26 ENCOUNTER — Encounter (HOSPITAL_COMMUNITY): Payer: Self-pay

## 2024-02-27 ENCOUNTER — Ambulatory Visit: Payer: Self-pay | Admitting: Cardiology

## 2024-02-27 ENCOUNTER — Ambulatory Visit
Admission: RE | Admit: 2024-02-27 | Discharge: 2024-02-27 | Disposition: A | Payer: MEDICAID | Source: Ambulatory Visit | Attending: Cardiology

## 2024-02-27 DIAGNOSIS — R072 Precordial pain: Secondary | ICD-10-CM

## 2024-02-27 MED ORDER — METOPROLOL TARTRATE 5 MG/5ML IV SOLN: 10.0000 mg | Freq: Once | INTRAVENOUS | Status: DC | PRN

## 2024-02-27 MED ORDER — DILTIAZEM HCL 25 MG/5ML IV SOLN: 10.0000 mg | INTRAVENOUS | Status: DC | PRN

## 2024-02-27 MED ORDER — IOHEXOL 350 MG/ML SOLN
100.0000 mL | Freq: Once | INTRAVENOUS | Status: AC | PRN
  Administered 2024-02-27: 100 mL via INTRAVENOUS

## 2024-02-27 MED ORDER — NITROGLYCERIN 0.4 MG SL SUBL
0.8000 mg | SUBLINGUAL_TABLET | Freq: Once | SUBLINGUAL | Status: AC
  Administered 2024-02-27: 0.8 mg via SUBLINGUAL
  Filled 2024-02-27: qty 25

## 2024-02-27 NOTE — Progress Notes (Signed)
 Patient tolerated procedure well. W/C to lobby.  Ambulate w/o difficulty. Denies light headedness or being dizzy. Encouraged to drink extra water today and reasoning explained. Verbalized understanding. All questions answered. ABC intact. No further needs. Discharge from procedure area w/o issues.
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
## 2024-02-28 ENCOUNTER — Encounter: Payer: Self-pay | Admitting: Family Medicine

## 2024-02-28 ENCOUNTER — Ambulatory Visit: Payer: MEDICAID | Admitting: Family Medicine

## 2024-02-28 VITALS — BP 107/74 | HR 66 | Temp 98.4°F | Ht 71.5 in | Wt 230.2 lb

## 2024-02-28 DIAGNOSIS — K5909 Other constipation: Secondary | ICD-10-CM

## 2024-02-28 DIAGNOSIS — D508 Other iron deficiency anemias: Secondary | ICD-10-CM

## 2024-02-28 MED ORDER — LINACLOTIDE 290 MCG PO CAPS: 290.0000 ug | ORAL_CAPSULE | Freq: Every day | ORAL | 11 refills | Status: AC

## 2024-03-13 ENCOUNTER — Ambulatory Visit: Payer: MEDICAID | Admitting: Clinical

## 2024-03-25 ENCOUNTER — Ambulatory Visit: Payer: MEDICAID | Admitting: Clinical

## 2024-04-08 ENCOUNTER — Ambulatory Visit: Payer: MEDICAID | Admitting: Cardiology

## 2024-05-15 ENCOUNTER — Ambulatory Visit: Payer: MEDICAID | Admitting: Internal Medicine

## 2024-08-17 ENCOUNTER — Ambulatory Visit: Payer: MEDICAID | Admitting: Adult Health

## 2025-03-01 ENCOUNTER — Ambulatory Visit: Payer: MEDICAID | Admitting: Family Medicine
